# Patient Record
Sex: Female | Born: 1960 | Race: White | Hispanic: No | Marital: Single | Smoking: Former smoker
Health system: Southern US, Community
[De-identification: ages and names within clinical notes are randomized; demographics above are authoritative.]

## PROBLEM LIST (undated history)

## (undated) DIAGNOSIS — H8109 Meniere's disease, unspecified ear: Secondary | ICD-10-CM

## (undated) DIAGNOSIS — K8020 Calculus of gallbladder without cholecystitis without obstruction: Secondary | ICD-10-CM

## (undated) DIAGNOSIS — E785 Hyperlipidemia, unspecified: Secondary | ICD-10-CM

## (undated) DIAGNOSIS — I10 Essential (primary) hypertension: Secondary | ICD-10-CM

## (undated) DIAGNOSIS — D649 Anemia, unspecified: Secondary | ICD-10-CM

## (undated) DIAGNOSIS — F419 Anxiety disorder, unspecified: Secondary | ICD-10-CM

## (undated) DIAGNOSIS — Z8719 Personal history of other diseases of the digestive system: Secondary | ICD-10-CM

## (undated) DIAGNOSIS — K219 Gastro-esophageal reflux disease without esophagitis: Secondary | ICD-10-CM

## (undated) HISTORY — DX: Hyperlipidemia, unspecified: E78.5

## (undated) HISTORY — PX: TONSILLECTOMY: SUR1361

## (undated) HISTORY — DX: Essential (primary) hypertension: I10

## (undated) HISTORY — PX: OTHER SURGICAL HISTORY: SHX169

## (undated) HISTORY — DX: Meniere's disease, unspecified ear: H81.09

## (undated) HISTORY — DX: Anxiety disorder, unspecified: F41.9

## (undated) HISTORY — DX: Calculus of gallbladder without cholecystitis without obstruction: K80.20

---

## 2012-01-22 ENCOUNTER — Other Ambulatory Visit (HOSPITAL_COMMUNITY): Payer: Self-pay

## 2012-03-03 ENCOUNTER — Encounter (HOSPITAL_COMMUNITY): Payer: Self-pay | Admitting: Pharmacy Technician

## 2012-03-09 ENCOUNTER — Encounter (HOSPITAL_COMMUNITY): Payer: Self-pay

## 2012-03-09 ENCOUNTER — Ambulatory Visit (HOSPITAL_COMMUNITY)
Admission: RE | Admit: 2012-03-09 | Discharge: 2012-03-09 | Disposition: A | Discharge status: Home / Self Care | Payer: BC Managed Care – PPO | Source: Ambulatory Visit | Attending: Podiatry | Admitting: Podiatry

## 2012-03-09 ENCOUNTER — Encounter (HOSPITAL_COMMUNITY)
Admission: RE | Admit: 2012-03-09 | Discharge: 2012-03-09 | Disposition: A | Discharge status: Home / Self Care | Payer: BC Managed Care – PPO | Source: Ambulatory Visit | Attending: Podiatry | Admitting: Podiatry

## 2012-03-09 HISTORY — DX: Anemia, unspecified: D64.9

## 2012-03-09 HISTORY — DX: Personal history of other diseases of the digestive system: Z87.19

## 2012-03-09 HISTORY — DX: Gastro-esophageal reflux disease without esophagitis: K21.9

## 2012-03-09 LAB — BASIC METABOLIC PANEL
Calcium: 9.7 mg/dL (ref 8.4–10.5)
Creatinine, Ser: 0.73 mg/dL (ref 0.50–1.10)
GFR calc Af Amer: >90 mL/min (ref >90)

## 2012-03-09 LAB — HEMOGLOBIN AND HEMATOCRIT, BLOOD: Hemoglobin: 13.5 g/dL (ref 12.0–15.0)

## 2012-03-09 NOTE — Patient Instructions (Addendum)
Yesenia Cox  03/09/2012   Your procedure is scheduled on:  03/17/2012  Report to [facility] at  615  AM.  Call this number if you have problems the morning of surgery: [phone]   Remember:   Do not eat food or drink liquids after midnight.   Take these medicines the morning of surgery with A SIP OF WATER: pristiq,protonix   Do not wear jewelry, make-up or nail polish.  Do not wear lotions, powders, or perfumes.   Do not shave 48 hours prior to surgery. Men may shave face and neck.  Do not bring valuables to the hospital.  Contacts, dentures or bridgework may not be worn into surgery.  Leave suitcase in the car. After surgery it may be brought to your room.  For patients admitted to the hospital, checkout time is 11:00 AM the day of discharge.   Patients discharged the day of surgery will not be allowed to drive  home.  Name and phone number of your driver: family  Special Instructions: Shower using CHG 2 nights before surgery and the night before surgery.  If you shower the day of surgery use CHG.  Use special wash - you have one bottle of CHG for all showers.  You should use approximately 1/3 of the bottle for each shower.   Please read over the following fact sheets that you were given: Pain Booklet, Coughing and Deep Breathing, MRSA Information, Surgical Site Infection Prevention, Anesthesia Post-op Instructions and Care and Recovery After Surgery Bunionectomy A bunionectomy is surgery to remove a bunion. A bunion is an enlargement of the joint at the base of the big toe. It is made up of bone and soft tissue on the inside part of the joint. Over time, a painful lump appears on the inside of the joint. The big toe begins to point inward toward the second toe. New bone growth can occur and a bone spur may form. The pain eventually causes difficulty walking. A bunion usually results from inflammation caused by the irritation of poorly fitting shoes. It often begins later in  life. A bunionectomy is performed when nonsurgical treatment no longer works. When surgery is needed, the extent of the procedure will depend on the degree of deformity of the foot. Your surgeon will discuss with you the different procedures and what will work best for you depending on your age and health. LET YOUR CAREGIVER KNOW ABOUT:   Previous problems with anesthetics or medicines used to numb the skin.  Allergies to dyes, iodine, foods, and/or latex.  Medicines taken including herbs, eye drops, prescription medicines (especially medicines used to "thin the blood"), aspirin and other over-the-counter medicines, and steroids (by mouth or as a cream).  History of bleeding or blood problems.  Possibility of pregnancy, if this applies.  History of blood clots in your legs and/or lungs .  Previous surgery.  Other important health problems. RISKS AND COMPLICATIONS   Infection.  Pain.  Nerve damage.  Possibility that the bunion will recur. BEFORE THE PROCEDURE  You should be present 60 minutes prior to your procedure or as directed.  PROCEDURE  Surgery is often done so that you can go home the same day (outpatient). It may be done in a hospital or in an outpatient surgical center. An anesthetic will be used to help you sleep during the procedure. Sometimes, a spinal anesthetic is used to make you numb below the waist. A cut (incision) is made over  the swollen area at the first joint of the big toe. The enlarged lump will be removed. If there is a need to reposition the bones of the big toe, this may require more than 1 incision. The bone itself may need to be cut. Screws and wires may be used in the repair. These can be removed at a later date. In severe cases, the entire joint may need to be removed and a joint replacement inserted. When done, the incision is closed with stitches (sutures). Skin adhesive strips may be added for reinforcement. They help hold the incision closed.  AFTER  THE PROCEDURE  Compression bandages (dressings) are then wrapped around the wound. This helps to keep the foot in alignment and reduce swelling. Your foot will be monitored for bleeding and swelling. You will need to stay for a few hours in the recovery area before being discharged. This allows time for the anesthesia to wear off. You will be discharged home when you are awake, stable, and doing well. HOME CARE INSTRUCTIONS   You can expect to return to normal activities within 6 to 8 weeks after surgery. The foot is at increased risk for swelling for several months. When you can expect to bear weight on the operated foot will depend on the extent of your surgery. The milder the deformity, the less tissue is removed and the sooner the return to normal activity level. During the recovery period, a special shoe, boot, or cast may be worn to accommodate the surgical bandage and to help provide stability to the foot.  Once you are home, an ice pack applied to the operative site may help with discomfort and keep swelling down. Stop using the ice if it causes discomfort.  Keep your feet raised (elevated) when possible to lessen swelling.  If you have an elastic bandage on your foot and you have numbness, tingling, or your foot becomes cold and blue, adjust the bandage to make it comfortable.  Change dressings as directed.  Keep the wound dry and clean. The wound may be washed gently with soap and water. Gently blot dry without rubbing. Do not take baths or use swimming pools or hot tubs for 10 days, or as instructed by your caregiver.  Only take over-the-counter or prescription medicines for pain, discomfort, or fever as directed by your caregiver.  You may continue a normal diet as directed.  For activity, use crutches with no weight bearing or your orthopedic shoe as directed. Continue to use crutches or a cane as directed until you can stand without causing pain. SEEK MEDICAL CARE IF:   You have  redness, swelling, bruising, or increasing pain in the wound.  There is pus coming from the wound.  You have drainage from a wound lasting longer than 1 day.  You have an oral temperature above 102 F (38.9 C).  You notice a bad smell coming from the wound or dressing.  The wound breaks open after sutures have been removed.  You develop dizzy episodes or fainting while standing.  You have persistent nausea or vomiting.  Your toes become cold.  Pain is not relieved with medicines. SEEK IMMEDIATE MEDICAL CARE IF:   You develop a rash.  You have difficulty breathing.  You develop any reaction or side effects to medicines given.  Your toes are numb or blue, or you have severe pain. MAKE SURE YOU:   Understand these instructions.  Will watch your condition.  Will get help right away if you  are not doing well or get worse. Document Released: 12/19/2004 Document Revised: 03/30/2011 Document Reviewed: 01/24/2007 [facility] Patient Information 2013 [place], [place]. PATIENT INSTRUCTIONS POST-ANESTHESIA  IMMEDIATELY FOLLOWING SURGERY:  Do not drive or operate machinery for the first twenty four hours after surgery.  Do not make any important decisions for twenty four hours after surgery or while taking narcotic pain medications or sedatives.  If you develop intractable nausea and vomiting or a severe headache please notify your doctor immediately.  FOLLOW-UP:  Please make an appointment with your surgeon as instructed. You do not need to follow up with anesthesia unless specifically instructed to do so.  WOUND CARE INSTRUCTIONS (if applicable):  Keep a dry clean dressing on the anesthesia/puncture wound site if there is drainage.  Once the wound has quit draining you may leave it open to air.  Generally you should leave the bandage intact for twenty four hours unless there is drainage.  If the epidural site drains for more than 36-48 hours please call the anesthesia  department.  QUESTIONS?:  Please feel free to call your physician or the hospital operator if you have any questions, and they will be happy to assist you.

## 2012-03-10 ENCOUNTER — Inpatient Hospital Stay (HOSPITAL_COMMUNITY): Admission: RE | Admit: 2012-03-10 | Payer: Self-pay | Source: Ambulatory Visit

## 2012-03-11 ENCOUNTER — Other Ambulatory Visit (HOSPITAL_COMMUNITY): Payer: Self-pay

## 2012-03-17 ENCOUNTER — Encounter (HOSPITAL_COMMUNITY): Payer: Self-pay | Admitting: *Deleted

## 2012-03-17 ENCOUNTER — Ambulatory Visit (HOSPITAL_COMMUNITY): Payer: BC Managed Care – PPO

## 2012-03-17 ENCOUNTER — Encounter (HOSPITAL_COMMUNITY): Payer: Self-pay | Admitting: Anesthesiology

## 2012-03-17 ENCOUNTER — Encounter (HOSPITAL_COMMUNITY)
Admission: RE | Disposition: A | Discharge status: Home / Self Care | Payer: Self-pay | Source: Ambulatory Visit | Attending: Podiatry

## 2012-03-17 ENCOUNTER — Ambulatory Visit (HOSPITAL_COMMUNITY)
Admission: RE | Admit: 2012-03-17 | Discharge: 2012-03-17 | Disposition: A | Discharge status: Home / Self Care | Payer: BC Managed Care – PPO | Source: Ambulatory Visit | Attending: Podiatry | Admitting: Podiatry

## 2012-03-17 ENCOUNTER — Ambulatory Visit (HOSPITAL_COMMUNITY): Payer: BC Managed Care – PPO | Admitting: Anesthesiology

## 2012-03-17 DIAGNOSIS — M948X9 Other specified disorders of cartilage, unspecified sites: Secondary | ICD-10-CM | POA: Insufficient documentation

## 2012-03-17 DIAGNOSIS — Z01812 Encounter for preprocedural laboratory examination: Secondary | ICD-10-CM | POA: Insufficient documentation

## 2012-03-17 DIAGNOSIS — M25579 Pain in unspecified ankle and joints of unspecified foot: Secondary | ICD-10-CM | POA: Insufficient documentation

## 2012-03-17 DIAGNOSIS — M7752 Other enthesopathy of left foot: Secondary | ICD-10-CM

## 2012-03-17 HISTORY — PX: BONE EXOSTOSIS EXCISION: SHX1249

## 2012-03-17 SURGERY — EXCISION, EXOSTOSIS
Anesthesia: Monitor Anesthesia Care | Site: Foot | Laterality: Left | Wound class: Clean

## 2012-03-17 MED ORDER — BUPIVACAINE LIPOSOME 1.3 % IJ SUSP
8.0000 mL | Freq: Once | INTRAMUSCULAR | Status: DC
  Filled 2012-03-17: qty 20

## 2012-03-17 MED ORDER — FENTANYL CITRATE 0.05 MG/ML IJ SOLN
INTRAMUSCULAR | Status: AC
  Filled 2012-03-17: qty 2

## 2012-03-17 MED ORDER — BUPIVACAINE HCL (PF) 0.5 % IJ SOLN
INTRAMUSCULAR | Status: DC | PRN
  Administered 2012-03-17: 19 mL

## 2012-03-17 MED ORDER — LIDOCAINE HCL (PF) 1 % IJ SOLN
INTRAMUSCULAR | Status: AC
  Filled 2012-03-17: qty 5

## 2012-03-17 MED ORDER — CEFAZOLIN SODIUM 1 G IJ SOLR
INTRAMUSCULAR | Status: AC
  Filled 2012-03-17: qty 10

## 2012-03-17 MED ORDER — ONDANSETRON HCL 4 MG/2ML IJ SOLN
INTRAMUSCULAR | Status: AC
  Filled 2012-03-17: qty 2

## 2012-03-17 MED ORDER — FENTANYL CITRATE 0.05 MG/ML IJ SOLN
INTRAMUSCULAR | Status: DC | PRN
  Administered 2012-03-17 (×3): 25 ug via INTRAVENOUS
  Administered 2012-03-17: 50 ug via INTRAVENOUS

## 2012-03-17 MED ORDER — ONDANSETRON HCL 4 MG/2ML IJ SOLN: 4.0000 mg | Freq: Once | INTRAMUSCULAR | Status: DC | PRN

## 2012-03-17 MED ORDER — CEFAZOLIN SODIUM-DEXTROSE 2-3 GM-% IV SOLR
2.0000 g | Freq: Once | INTRAVENOUS | Status: AC
  Administered 2012-03-17: 2 g via INTRAVENOUS

## 2012-03-17 MED ORDER — ONDANSETRON HCL 4 MG/2ML IJ SOLN
4.0000 mg | Freq: Once | INTRAMUSCULAR | Status: AC
  Administered 2012-03-17: 4 mg via INTRAVENOUS

## 2012-03-17 MED ORDER — FENTANYL CITRATE 0.05 MG/ML IJ SOLN
25.0000 ug | INTRAMUSCULAR | Status: DC | PRN
  Administered 2012-03-17: 50 ug via INTRAVENOUS
  Administered 2012-03-17: 25 ug via INTRAVENOUS

## 2012-03-17 MED ORDER — FENTANYL CITRATE 0.05 MG/ML IJ SOLN: 25.0000 ug | INTRAMUSCULAR | Status: DC | PRN

## 2012-03-17 MED ORDER — MIDAZOLAM HCL 2 MG/2ML IJ SOLN
1.0000 mg | INTRAMUSCULAR | Status: DC | PRN
  Administered 2012-03-17: 2 mg via INTRAVENOUS

## 2012-03-17 MED ORDER — LACTATED RINGERS IV SOLN
INTRAVENOUS | Status: DC
  Administered 2012-03-17: 07:00:00 via INTRAVENOUS

## 2012-03-17 MED ORDER — BUPIVACAINE HCL (PF) 0.5 % IJ SOLN
INTRAMUSCULAR | Status: AC
  Filled 2012-03-17: qty 30

## 2012-03-17 MED ORDER — SODIUM CHLORIDE 0.9 % IJ SOLN
INTRAMUSCULAR | Status: DC | PRN
  Administered 2012-03-17: 09:00:00

## 2012-03-17 MED ORDER — CEFAZOLIN SODIUM-DEXTROSE 2-3 GM-% IV SOLR
INTRAVENOUS | Status: AC
  Filled 2012-03-17: qty 50

## 2012-03-17 MED ORDER — LIDOCAINE HCL (PF) 1 % IJ SOLN
INTRAMUSCULAR | Status: AC
  Filled 2012-03-17: qty 30

## 2012-03-17 MED ORDER — PROPOFOL 10 MG/ML IV EMUL
INTRAVENOUS | Status: AC
  Filled 2012-03-17: qty 40

## 2012-03-17 MED ORDER — MIDAZOLAM HCL 2 MG/2ML IJ SOLN
INTRAMUSCULAR | Status: AC
  Filled 2012-03-17: qty 2

## 2012-03-17 MED ORDER — PROPOFOL INFUSION 10 MG/ML OPTIME
INTRAVENOUS | Status: DC | PRN
  Administered 2012-03-17: 75 ug/kg/min via INTRAVENOUS
  Administered 2012-03-17: 08:00:00 via INTRAVENOUS

## 2012-03-17 SURGICAL SUPPLY — 37 items
BAG HAMPER (MISCELLANEOUS) ×2 IMPLANT
BANDAGE ELASTIC 4 VELCRO NS (GAUZE/BANDAGES/DRESSINGS) ×2 IMPLANT
BANDAGE ESMARK 4X12 BL STRL LF (DISPOSABLE) ×1 IMPLANT
BANDAGE GAUZE ELAST BULKY 4 IN (GAUZE/BANDAGES/DRESSINGS) ×2 IMPLANT
BENZOIN TINCTURE PRP APPL 2/3 (GAUZE/BANDAGES/DRESSINGS) ×2 IMPLANT
BLADE AVERAGE 25X9 (BLADE) ×2 IMPLANT
BLADE SURG 15 STRL LF DISP TIS (BLADE) ×1 IMPLANT
BLADE SURG 15 STRL SS (BLADE) ×1
BNDG ESMARK 4X12 BLUE STRL LF (DISPOSABLE) ×2
CHLORAPREP W/TINT 26ML (MISCELLANEOUS) ×2 IMPLANT
CLOTH BEACON ORANGE TIMEOUT ST (SAFETY) ×2 IMPLANT
COVER LIGHT HANDLE STERIS (MISCELLANEOUS) ×4 IMPLANT
CUFF TOURNIQUET SINGLE 18IN (TOURNIQUET CUFF) ×2 IMPLANT
DRAPE C-ARM FOLDED MOBILE STRL (DRAPES) ×2 IMPLANT
DRAPE OEC MINIVIEW 54X84 (DRAPES) ×2 IMPLANT
DRSG ADAPTIC 3X8 NADH LF (GAUZE/BANDAGES/DRESSINGS) ×2 IMPLANT
DURA STEPPER MED (CAST SUPPLIES) ×2 IMPLANT
ELECT REM PT RETURN 9FT ADLT (ELECTROSURGICAL) ×2
ELECTRODE REM PT RTRN 9FT ADLT (ELECTROSURGICAL) ×1 IMPLANT
GLOVE BIO SURGEON STRL SZ7.5 (GLOVE) ×2 IMPLANT
GOWN STRL REIN XL XLG (GOWN DISPOSABLE) ×4 IMPLANT
KIT ROOM TURNOVER APOR (KITS) ×2 IMPLANT
MANIFOLD NEPTUNE II (INSTRUMENTS) ×2 IMPLANT
NEEDLE HYPO 18GX1.5 BLUNT FILL (NEEDLE) ×2 IMPLANT
NEEDLE HYPO 27GX1-1/4 (NEEDLE) ×8 IMPLANT
NS IRRIG 1000ML POUR BTL (IV SOLUTION) ×2 IMPLANT
PACK BASIC LIMB (CUSTOM PROCEDURE TRAY) ×2 IMPLANT
PAD ARMBOARD 7.5X6 YLW CONV (MISCELLANEOUS) ×2 IMPLANT
RASP SM TEAR CROSS CUT (RASP) ×2 IMPLANT
SET BASIN LINEN APH (SET/KITS/TRAYS/PACK) ×2 IMPLANT
SPONGE GAUZE 4X4 12PLY (GAUZE/BANDAGES/DRESSINGS) ×2 IMPLANT
SPONGE LAP 18X18 X RAY DECT (DISPOSABLE) ×2 IMPLANT
STRIP CLOSURE SKIN 1/2X4 (GAUZE/BANDAGES/DRESSINGS) ×2 IMPLANT
SUT ETHIBOND 3 0 (SUTURE) ×2 IMPLANT
SUT VIC AB 4-0 PS2 27 (SUTURE) ×2 IMPLANT
SYR CONTROL 10ML LL (SYRINGE) ×8 IMPLANT
TOWEL OR 17X26 4PK STRL BLUE (TOWEL DISPOSABLE) IMPLANT

## 2012-03-17 NOTE — H&P (Signed)
HISTORY AND PHYSICAL INTERVAL NOTE:  03/17/2012  7:15 AM  Yesenia Cox  has presented today for surgery, with the diagnosis of symptomatic os peroneum left foot.  The various methods of treatment have been discussed with the patient.  No guarantees were given.  After consideration of risks, benefits and other options for treatment, the patient has consented to surgery.  I have reviewed the patients' chart and labs.    Patient Vitals for the past 24 hrs:  BP Temp Resp SpO2  03/17/12 0700 119/72 mmHg - 39 98 %  03/17/12 0643 117/76 mmHg - 22 99 %  03/17/12 0642 - 98.6 F (37 C) - -    A history and physical examination was performed in my office on 03/09/2012.  The patient was reexamined.  There have been no changes to this history and physical examination.  Dallas Schimke, DPM

## 2012-03-17 NOTE — Brief Op Note (Signed)
BRIEF OPERATIVE NOTE  SURGEON:   Dallas Schimke, DPM  OR STAFF:   Circulator: Rogene Houston, RN Scrub Person: Hurshel Party, CST; Eliane Decree Page, RN   PREOPERATIVE DIAGNOSIS:   Symptomatic os peroneum left foot  POSTOPERATIVE DIAGNOSIS: Same  PROCEDURE: Excision of os peroneum left foot  ANESTHESIA:  Monitor Anesthesia Care   HEMOSTASIS:   Pneumatic ankle tourniquet set at 250 mmHg  ESTIMATED BLOOD LOSS:   Minimal (<5 cc)  MATERIALS USED:  None  INJECTABLES: 0.5% Marcain plain and Exparel  PATHOLOGY:   Os peroneum  COMPLICATIONS:   None  INDICATIONS:  Painful os peroneum of the left foot that has not responded to conservative care.  DICTATION:  Office manager

## 2012-03-17 NOTE — Transfer of Care (Signed)
Immediate Anesthesia Transfer of Care Note  Patient: Yesenia Cox  Procedure(s) Performed: Procedure(s) with comments: EXOSTOSIS EXCISION (Left) - Excision of Os-Peroneum Left Foot  Patient Location: PACU  Anesthesia Type:MAC  Level of Consciousness: awake, alert  and oriented  Airway & Oxygen Therapy: Patient Spontanous Breathing and Patient connected to face mask oxygen  Post-op Assessment: Report given to PACU RN  Post vital signs: Reviewed and stable  Complications: No apparent anesthesia complications

## 2012-03-17 NOTE — Op Note (Signed)
OPERATIVE REPORT  SURGEON:   Dallas Schimke, DPM  OR STAFF:   Circulator: Rogene Houston, RN Scrub Person: Hurshel Party, CST; Eliane Decree Page, RN   PREOPERATIVE DIAGNOSIS:   Symptomatic os peroneum left foot  POSTOPERATIVE DIAGNOSIS: Same  PROCEDURE: Excision of os peroneum left foot  ANESTHESIA:  Monitor Anesthesia Care   HEMOSTASIS:   Pneumatic ankle tourniquet set at 250 mmHg  ESTIMATED BLOOD LOSS:   Minimal (<5 cc)  MATERIALS USED:  None  INJECTABLES: 0.5% Marcaine plain and Exparel  PATHOLOGY:   Os peroneum  COMPLICATIONS:   None  INDICATIONS:  Painful os peroneum of the left foot that has not responded to conservative care.  DESCRIPTION OF THE PROCEDURE:   The patient was brought to the operating room and placed on the operative table in the supine position.  A pneumatic ankle tourniquet was applied to the patient's ankle.  Following sedation, the surgical site was anesthetized with 0.5% Marcaine plain.  The foot was then prepped, scrubbed, and draped in the usual sterile technique.  The foot was elevated, exsanguinated and the pneumatic ankle tourniquet inflated to 250 mmHg.    Attention was directed to the lateral aspect of the left foot.  Fluoroscopy was used to identify the location of the os peroneum.  A linear longitudinal incision was made along the lateral aspect of the left foot.  Dissection was continued deep down to the level of the peroneal tendons.  The peroneus brevis tendon was retracted and the os peroneum identified.  The peroneal longus tendon was evaluated and no longitudinal tear was identified.  The os peroneum was freed of its attachment to the peroneal longus tendon.  It was passed from the operative field and sent to pathology for evaluation.  The portion of the tendon encompassing the os peroneum was tubularized using 3-0 Ethibond in a simple suture technique.  The wound was irrigated with copious amounts of sterile  irrigant.  The subcutaneous structures were reapproximated using 4-0 Vicryl.  The skin was reapproximated using 4-0 Vicryl and a running subcuticular manner.  The incision was reinforced with Steri-Strips.  Exparel was then injected around the surgical field.  A sterile, compressive dressing was applied.  The pneumatic ankle tourniquet was released and a prompt hyperemic response was noted to all digits of the operative foot.  The patient tolerated the procedure well.  The patient was then transferred to PACU with vital signs stable and vascular status intact to all toes of the operative foot.  Following a period of postoperative monitoring, the patient will be discharged home.

## 2012-03-17 NOTE — Anesthesia Preprocedure Evaluation (Signed)
Anesthesia Evaluation  Patient identified by MRN, date of birth, ID band Patient awake    Reviewed: Allergy & Precautions, H&P , NPO status , Patient's Chart, lab work & pertinent test results  Airway Mallampati: II TM Distance: >3 FB     Dental  (+) Teeth Intact   Pulmonary former smoker,  breath sounds clear to auscultation        Cardiovascular negative cardio ROS  Rhythm:Regular Rate:Normal     Neuro/Psych    GI/Hepatic hiatal hernia, GERD-  ,  Endo/Other    Renal/GU      Musculoskeletal   Abdominal   Peds  Hematology   Anesthesia Other Findings   Reproductive/Obstetrics                           Anesthesia Physical Anesthesia Plan  ASA: II  Anesthesia Plan: MAC   Post-op Pain Management:    Induction: Intravenous  Airway Management Planned: Nasal Cannula  Additional Equipment:   Intra-op Plan:   Post-operative Plan:   Informed Consent: I have reviewed the patients History and Physical, chart, labs and discussed the procedure including the risks, benefits and alternatives for the proposed anesthesia with the patient or authorized representative who has indicated his/her understanding and acceptance.     Plan Discussed with:   Anesthesia Plan Comments:         Anesthesia Quick Evaluation

## 2012-03-17 NOTE — Anesthesia Postprocedure Evaluation (Signed)
  Anesthesia Post-op Note  Patient: Yesenia Cox  Procedure(s) Performed: Procedure(s) with comments: EXOSTOSIS EXCISION (Left) - Excision of Os-Peroneum Left Foot  Patient Location: PACU  Anesthesia Type:MAC  Level of Consciousness: awake, alert  and oriented  Airway and Oxygen Therapy: Patient Spontanous Breathing and Patient connected to face mask oxygen  Post-op Pain: none  Post-op Assessment: Post-op Vital signs reviewed, Patient's Cardiovascular Status Stable, Respiratory Function Stable and Patent Airway  Post-op Vital Signs: Reviewed and stable  Complications: No apparent anesthesia complications

## 2012-03-18 ENCOUNTER — Encounter (HOSPITAL_COMMUNITY): Payer: Self-pay | Admitting: Podiatry

## 2012-10-14 HISTORY — PX: COLONOSCOPY: SHX174

## 2013-05-04 IMAGING — CR DG FOOT COMPLETE 3+V*L*
3 series · 3 of 3 positions shown · non-contrast
Comparison: None.

CLINICAL DATA: Preop.

LEFT FOOT - COMPLETE 3+ VIEW

[view not recorded (1 of 3)]
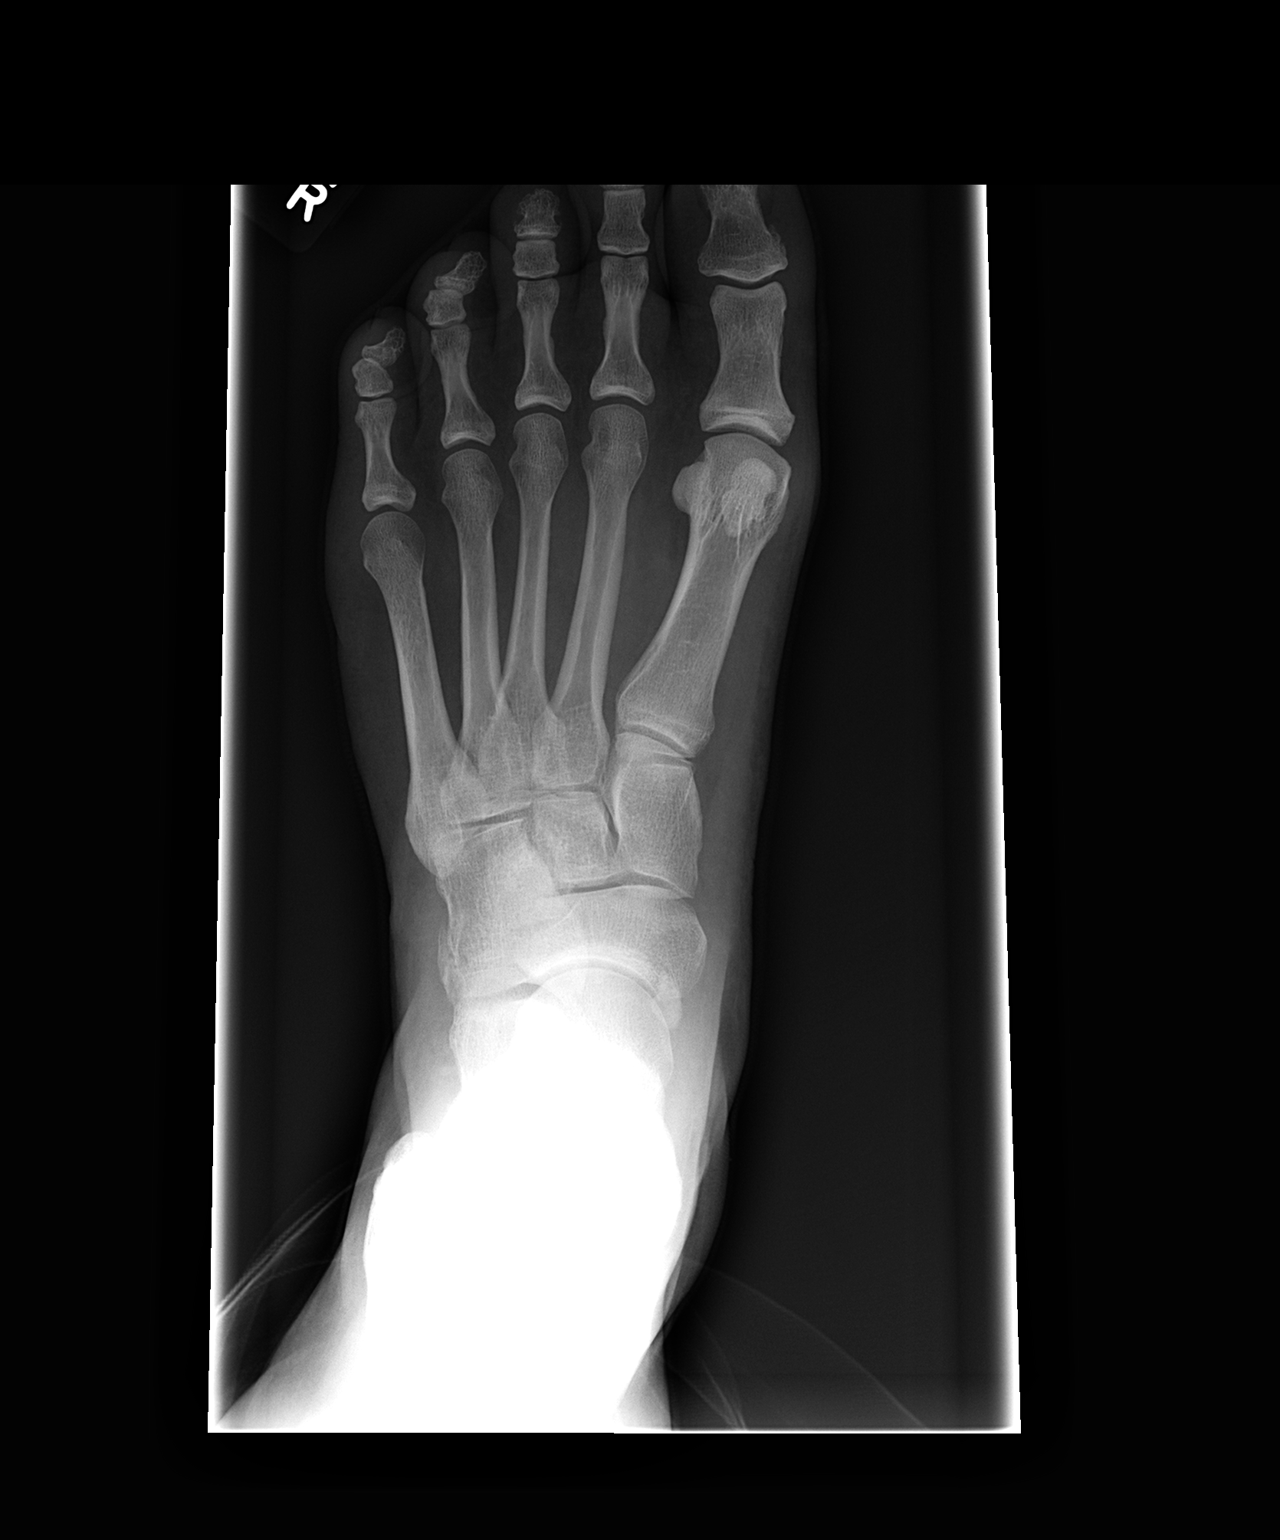

[view not recorded (2 of 3)]
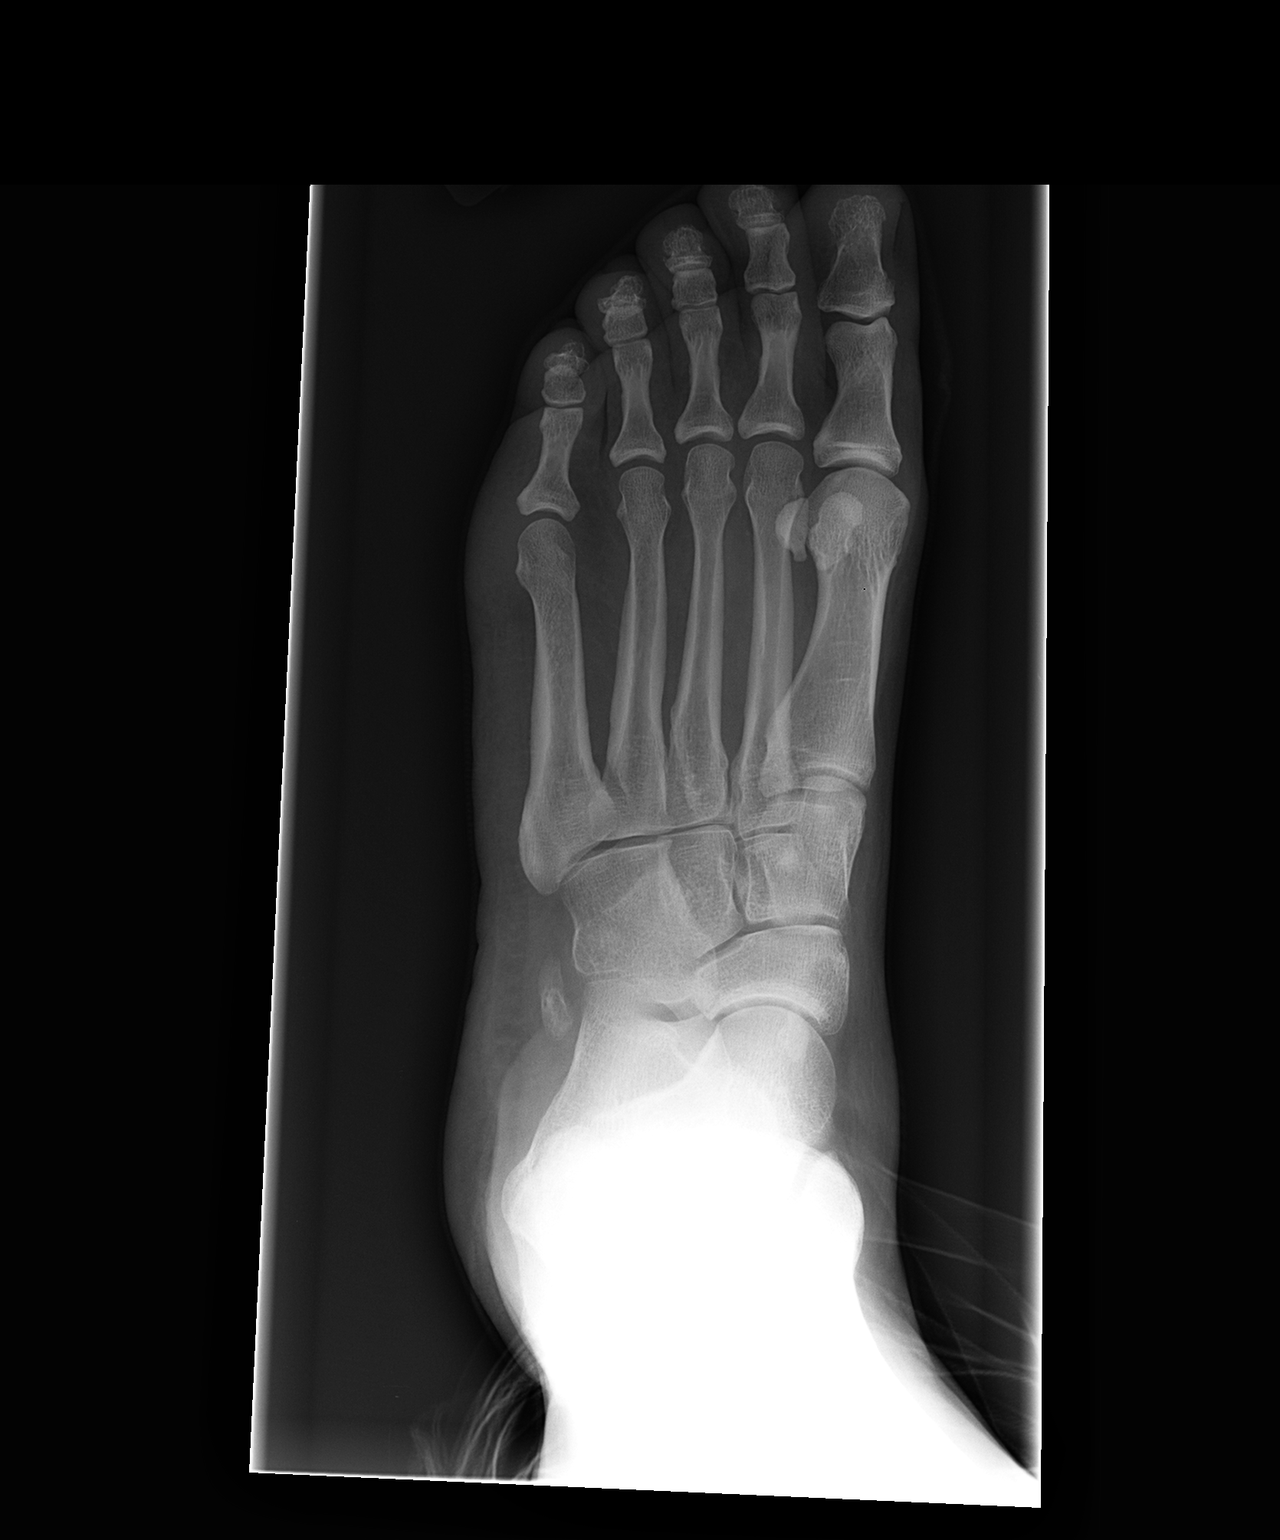

[view not recorded (3 of 3)]
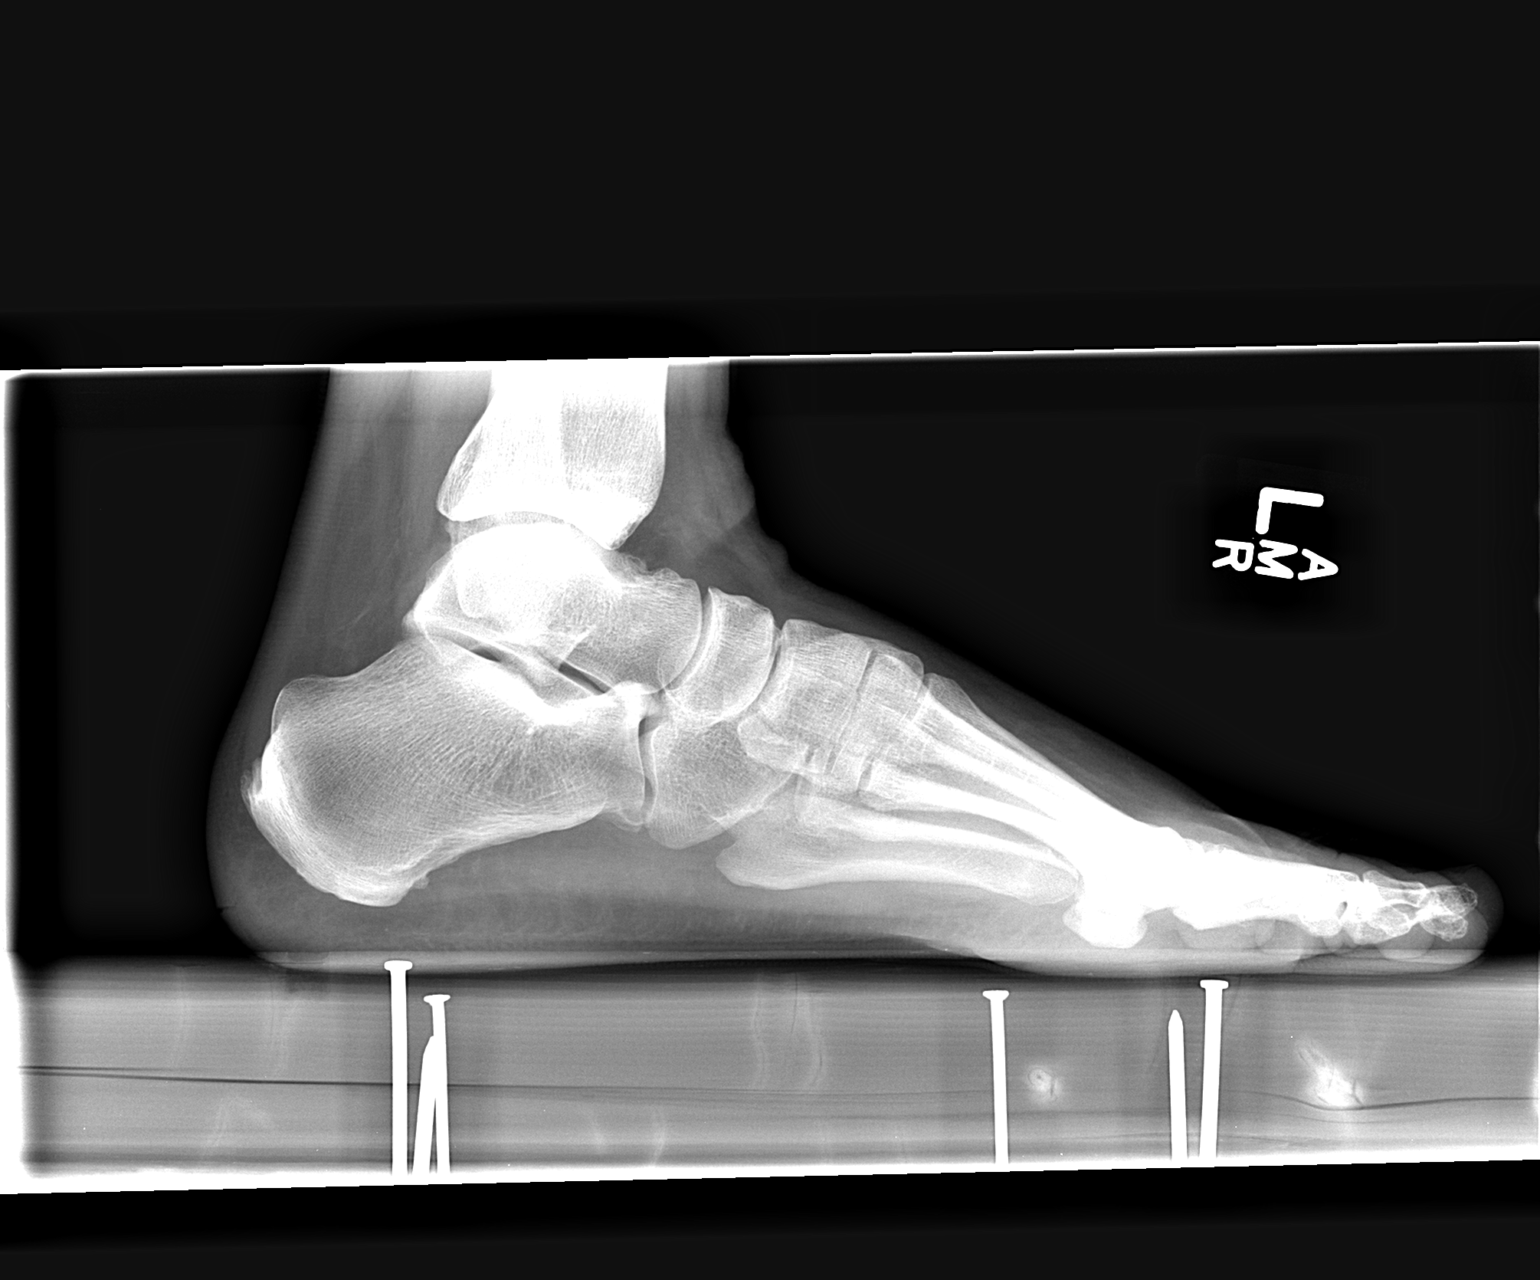

[3 of 3 positions shown; findings below may reference images not displayed]

FINDINGS: No acute osseous or joint abnormality.  Small calcaneal
spurs.
IMPRESSION: No acute findings.

## 2013-05-12 IMAGING — CR DG FOOT COMPLETE 3+V*L*
3 series · 3 of 3 positions shown · non-contrast
Comparison: 03/09/2012

CLINICAL DATA: Postop excision of the os peroneum.

LEFT FOOT - COMPLETE 3+ VIEW

[view not recorded (1 of 3)]
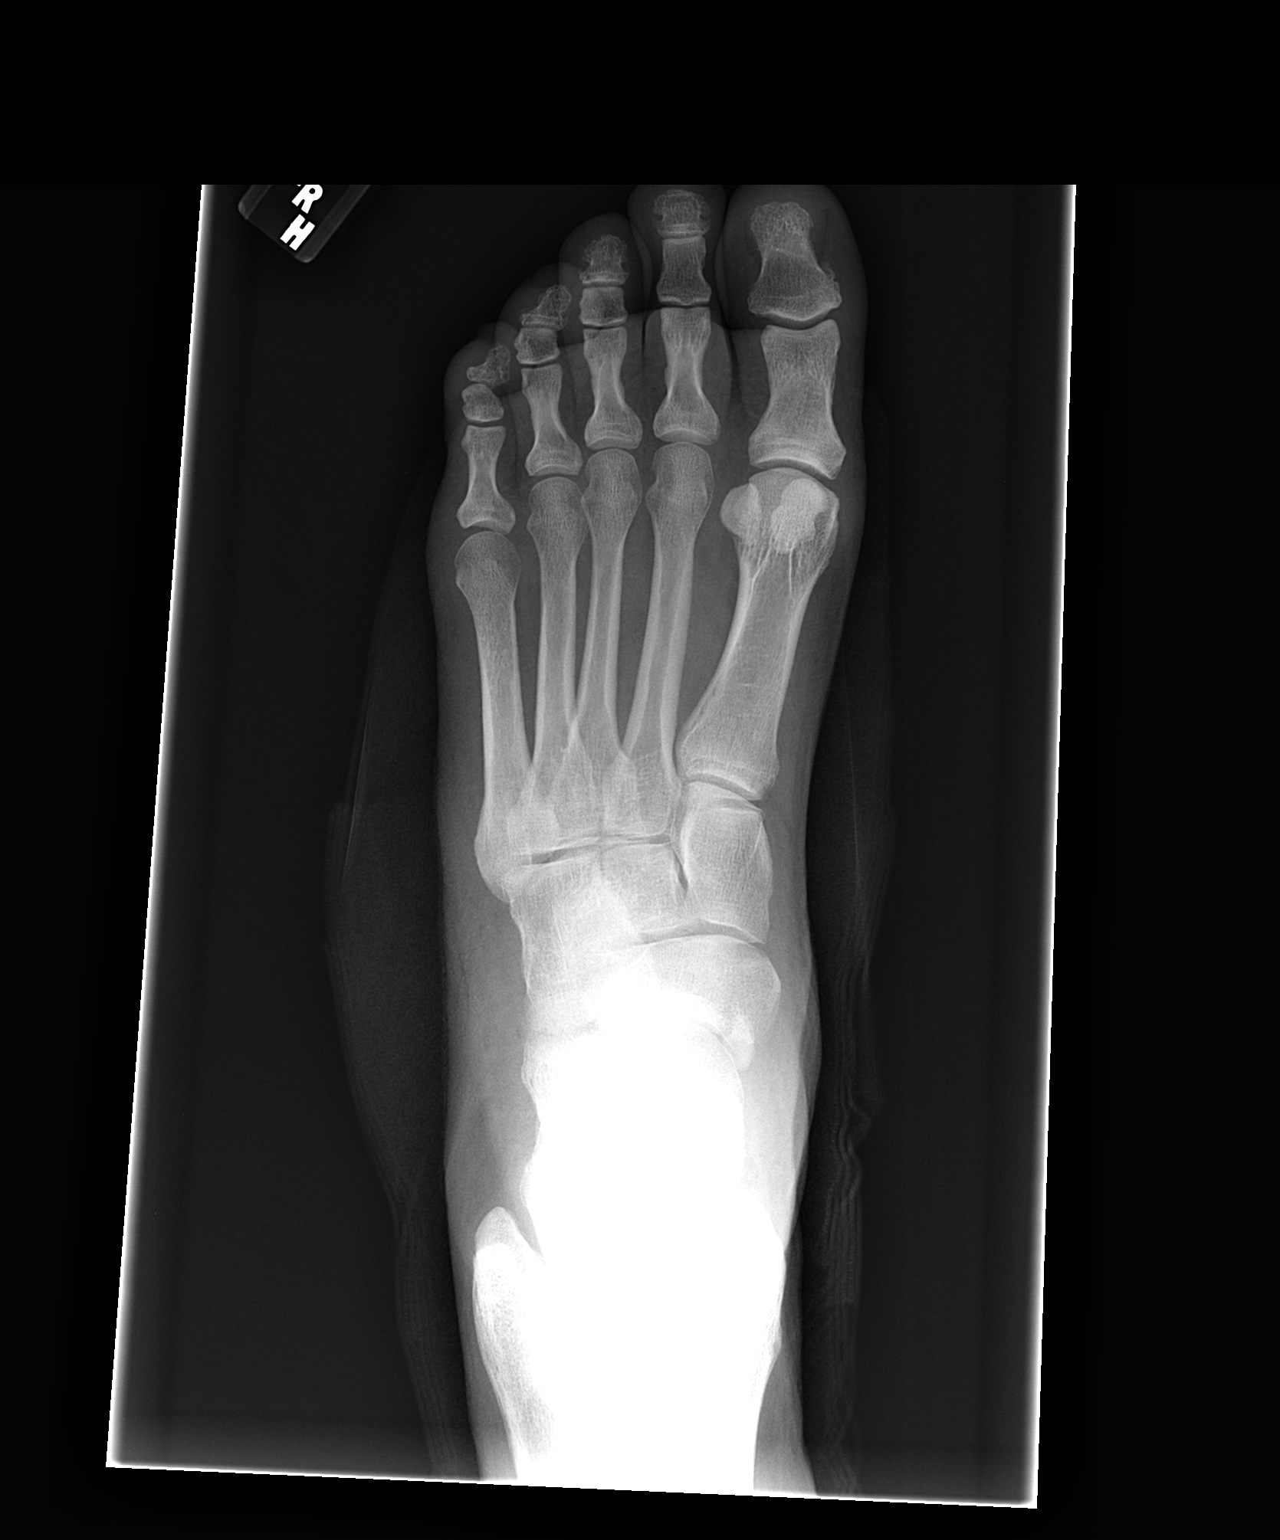

[view not recorded (2 of 3)]
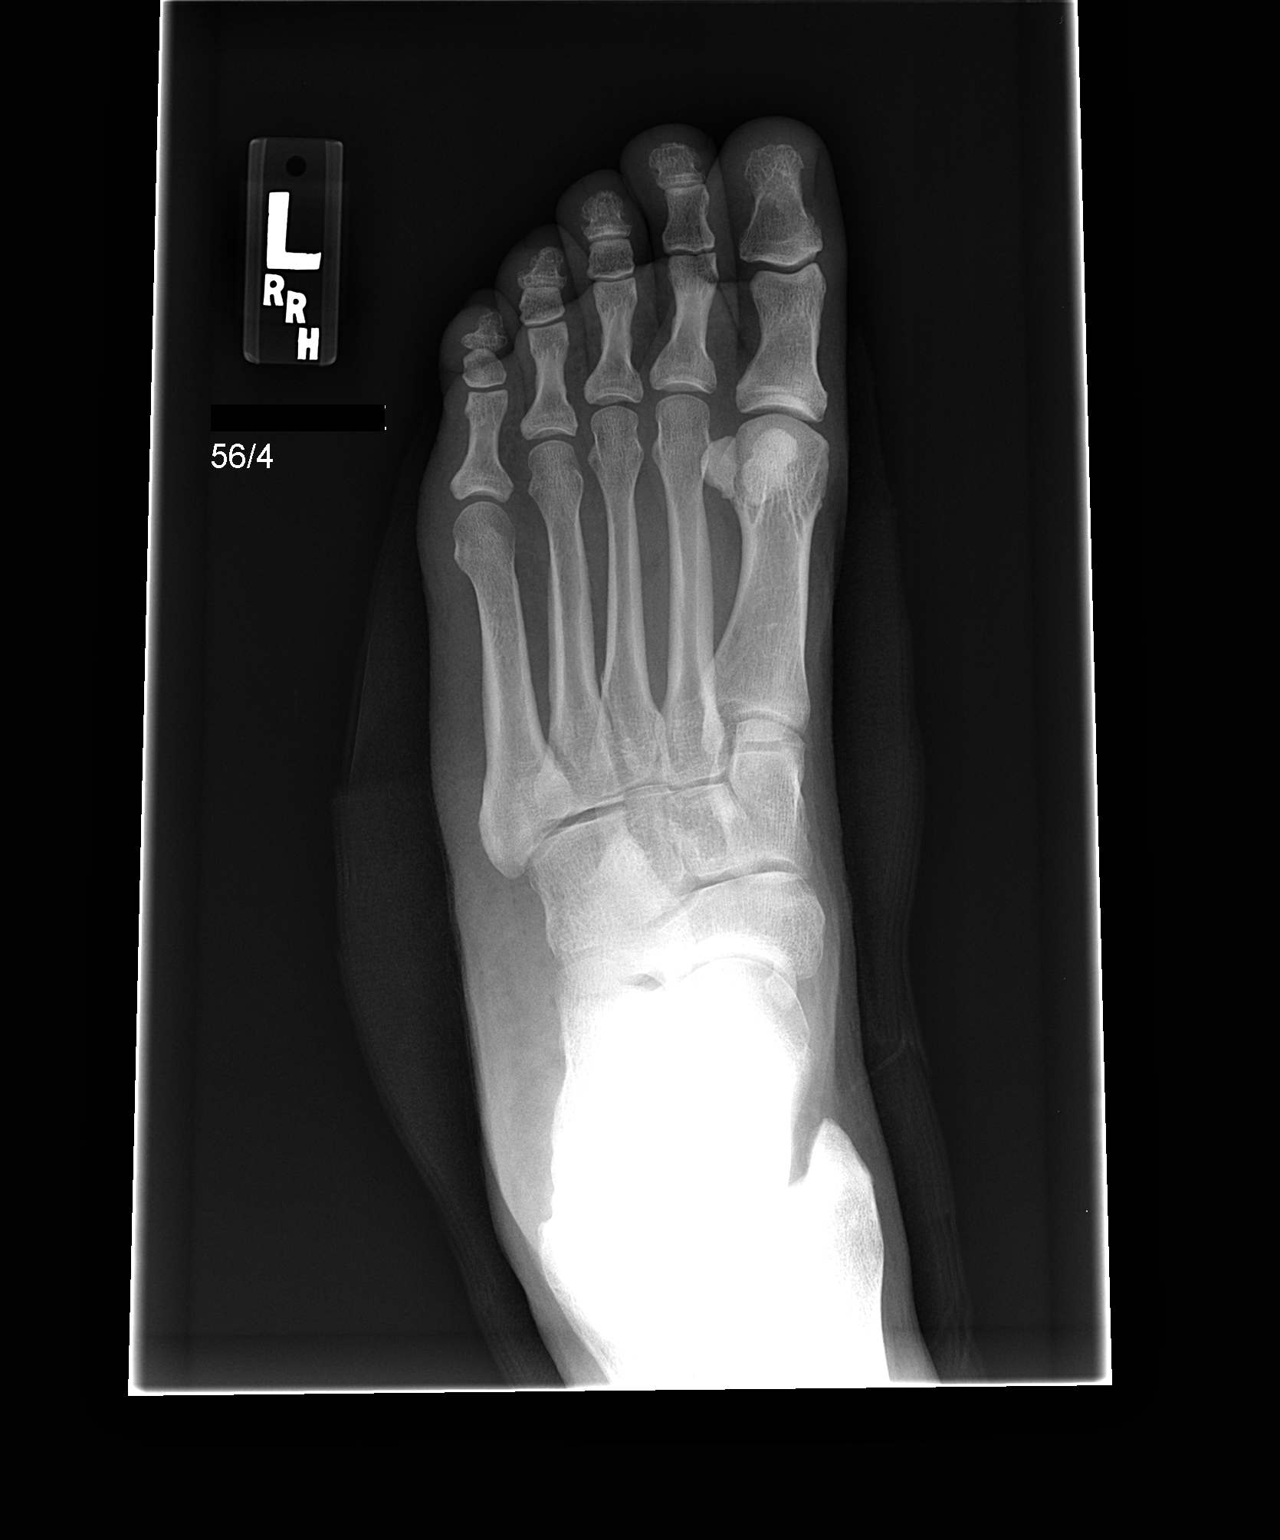

[view not recorded (3 of 3)]
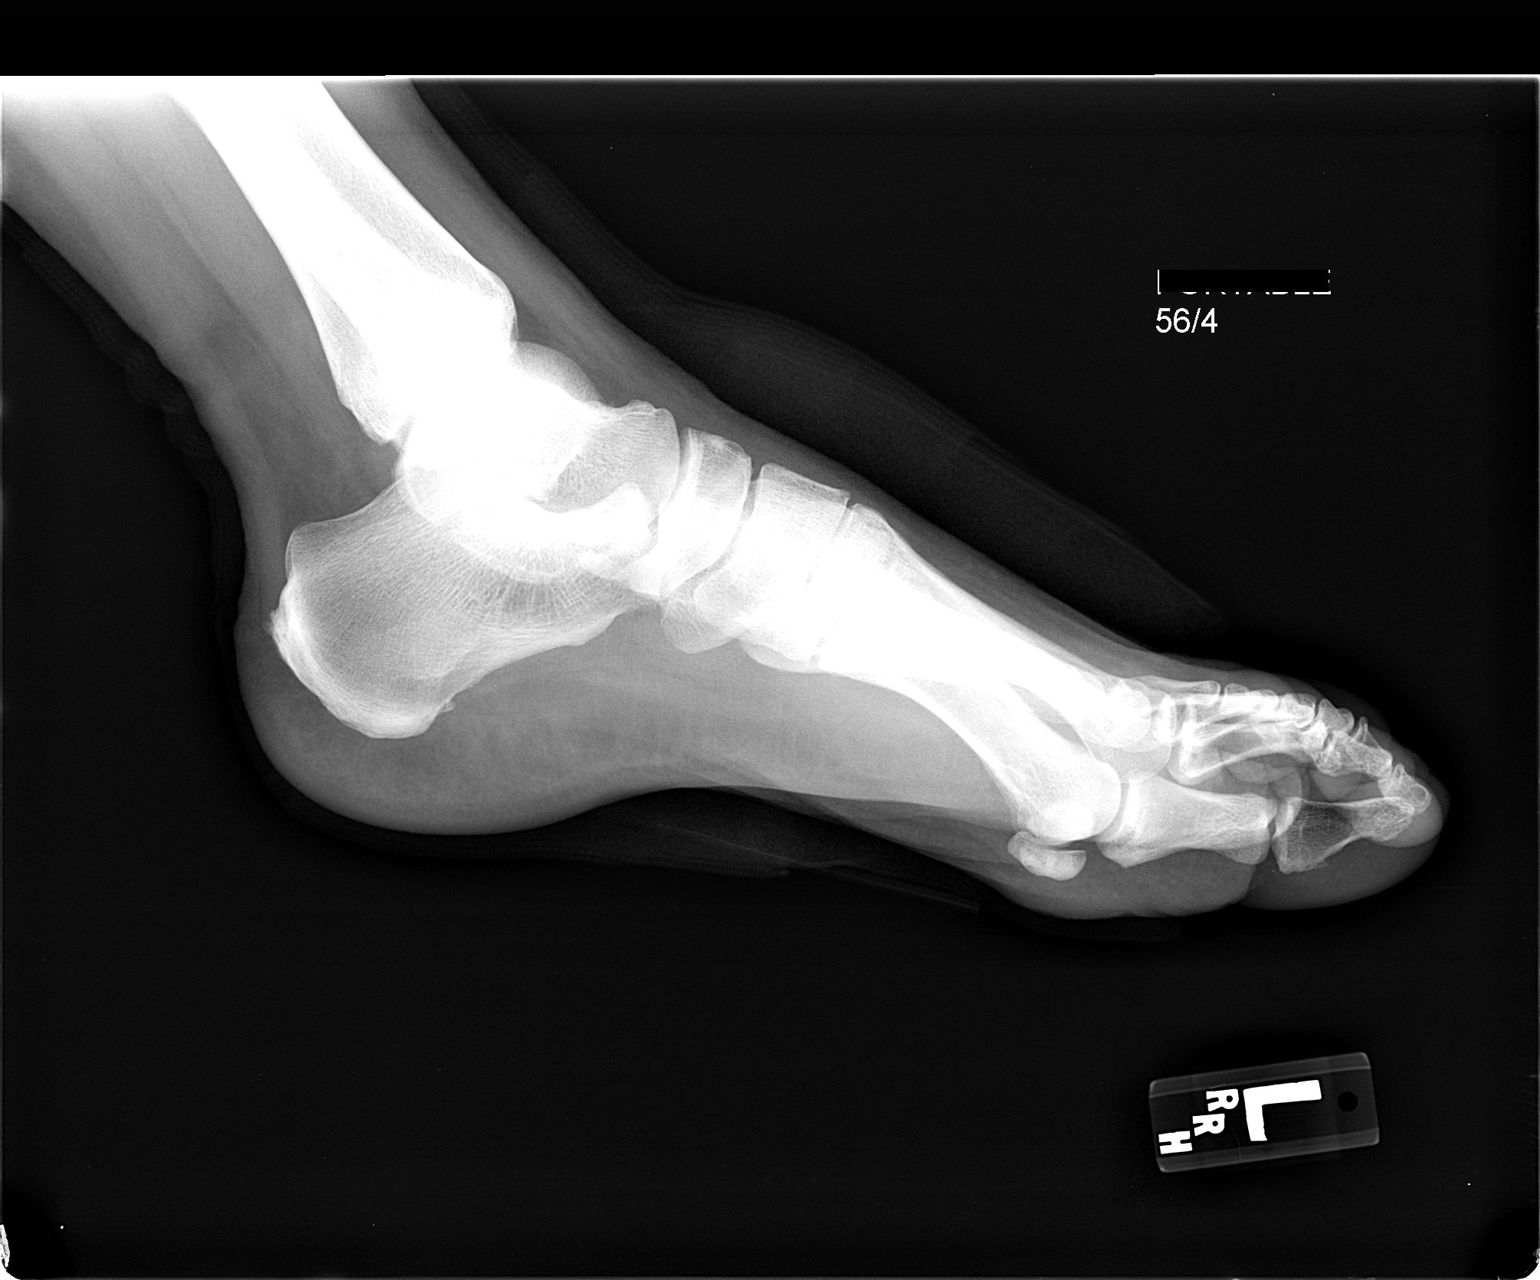

[3 of 3 positions shown; findings below may reference images not displayed]

FINDINGS: Interval excision of the os peroneum.  Slight soft tissue
stranding in the area.  No acute bony abnormality.
IMPRESSION: Postoperative changes.  No acute bony abnormality.

## 2014-09-18 ENCOUNTER — Encounter: Payer: Self-pay | Admitting: Family Medicine

## 2016-07-29 ENCOUNTER — Telehealth: Payer: Self-pay | Admitting: Gastroenterology

## 2016-08-11 ENCOUNTER — Encounter: Payer: Self-pay | Admitting: Gastroenterology

## 2016-08-11 NOTE — Telephone Encounter (Signed)
Dr.Armbruster reviewed records and noted patient can come in for an office visit to discuss why patient needs to have a repeat colonoscopy at this time. Left message for patient to call back and schedule appointment.

## 2016-10-06 ENCOUNTER — Encounter: Payer: Self-pay | Admitting: Gastroenterology

## 2016-10-06 ENCOUNTER — Ambulatory Visit (INDEPENDENT_AMBULATORY_CARE_PROVIDER_SITE_OTHER): Payer: BLUE CROSS/BLUE SHIELD | Admitting: Gastroenterology

## 2016-10-06 ENCOUNTER — Encounter (INDEPENDENT_AMBULATORY_CARE_PROVIDER_SITE_OTHER): Payer: Self-pay

## 2016-10-06 VITALS — BP 110/74 | HR 80 | Ht 63.5 in | Wt 217.1 lb

## 2016-10-06 DIAGNOSIS — K219 Gastro-esophageal reflux disease without esophagitis: Secondary | ICD-10-CM

## 2016-10-06 DIAGNOSIS — R195 Other fecal abnormalities: Secondary | ICD-10-CM

## 2016-10-06 DIAGNOSIS — K625 Hemorrhage of anus and rectum: Secondary | ICD-10-CM

## 2016-10-06 MED ORDER — SOD PICOSULFATE-MAG OX-CIT ACD 10-3.5-12 MG-GM -GM/160ML PO SOLN: 1.0000 | Freq: Once | ORAL | 0 refills | Status: AC

## 2016-10-06 NOTE — Progress Notes (Signed)
HPI :  56 y/o female with a history of GERD, anemia, here for a consultative visit at the request of Dr. Marjean Donna for a colonoscopy.  Patient reports having an episode of blood in the stools in July. She had red blood in the toilet x 1 mixed with the stools. She thought she had some perinal pain at the time. This has since resolved and not recurred. She then had positive occult blood testing per her gynecologist, leading to request for a colonoscopy. Her last colonoscopy was in 2014. She has some constipation at times, tries to manage it with diet and drinking water. This typically works for her. Rarely needs to take laxatives. No abdominal pains. No FH of colon cancer. She has some occasional bloating. No unexpected weight loss.   She has a history of reflux, she takes pantoprazole  once daily. This works well for her. She has heartburn and regurgitation. She denies any breakthrough of symptoms. No dysphagia. No FH of esophagus. Prior smoker, quit > 15 years ago. She has had a prior upper endoscopy around 5-10 years, done in [place], Dr. Despina Hidden. No Barrett's. She has had a trial of  protonix in the past and it did not work nearly as well.  Colonoscopy 10/14/2012 - no polyps, diverticulosis left colon  Past Medical History:  Diagnosis Date  . Anemia   . GERD (gastroesophageal reflux disease)   . H/O hiatal hernia      Past Surgical History:  Procedure Laterality Date  . BONE EXOSTOSIS EXCISION Left 03/17/2012   Procedure: EXOSTOSIS EXCISION;  Surgeon: Dallas Schimke, DPM;  Location: [facility];  Service: Orthopedics;  Laterality: Left;  Excision of Os-Peroneum Left Foot  . COLONOSCOPY  10/14/2012   Dr. Elder Cyphers  . roatator cuff Left 2006,June   [facility]  . TONSILLECTOMY  1981,Dec.   [place]   Family History  Problem Relation Age of Onset  . Hepatitis Mother   . Breast cancer Mother   . Heart attack Father   . Hypertension Sister   . Heart attack Sister   .  Stroke Sister   . Diabetes Sister   . Emphysema Sister   . Hypertension Sister   . GI problems Maternal Grandmother   . Heart attack Maternal Grandfather   . Cirrhosis Paternal Grandfather   . Colon cancer Neg Hx   . Rectal cancer Neg Hx   . Esophageal cancer Neg Hx   . Liver cancer Neg Hx    Social History  Substance Use Topics  . Smoking status: Former Smoker    Packs/day: 0.50    Years: 6.00  . Smokeless tobacco: Never Used  . Alcohol use Yes     Comment: Occasionally   Current Outpatient Prescriptions  Medication Sig Dispense Refill  . desvenlafaxine (PRISTIQ) 50 MG 24 hr tablet Take 50 mg by mouth daily.    . MULTIPLE VITAMIN PO Take 1 tablet by mouth daily.    . pantoprazole (PROTONIX) 40 MG tablet Take 40 mg by mouth daily.    . traMADol (ULTRAM) 50 MG tablet Take 50 mg by mouth daily.     . traZODone (DESYREL) 100 MG tablet Take 100 mg by mouth at bedtime.     No current facility-administered medications for this visit.    Allergies  Allergen Reactions  . Codeine Nausea And Vomiting  . Tetracyclines & Related Swelling     Review of Systems: All systems reviewed and negative except where noted in HPI.  No recent labs on file  Physical Exam: BP 110/74   Pulse 80   Ht 5' 3.5" (1.613 m)   Wt 217 lb 2 oz (98.5 kg)   BMI 37.86 kg/m  Constitutional: Pleasant,well-developed, female in no acute distress. HEENT: Normocephalic and atraumatic. Conjunctivae are normal. No scleral icterus. Neck supple.  Cardiovascular: Normal rate, regular rhythm.  Pulmonary/chest: Effort normal and breath sounds normal. No wheezing, rales or rhonchi. Abdominal: Soft, nondistended, nontender, protuberant. There are no masses palpable. No hepatomegaly. Extremities: no edema Lymphadenopathy: No cervical adenopathy noted. Neurological: Alert and oriented to person place and time. Skin: Skin is warm and dry. No rashes noted. Psychiatric: Normal mood and affect. Behavior is  normal.   ASSESSMENT AND PLAN: 56 year old female with history as outlined above here for assessment of the following issues:  Rectal bleeding / positive occult blood in stools - patient with 1 episode of overt rectal bleeding in July which has not recurred, followed by occult positive stool testing of what she thinks was normal stool. Her last colonoscopy was in 2014 and normal which is reassuring. I think it is unlikely she has a high risk lesion given her last colonoscopy, but I offered her a colonoscopy in light of her occult positive stool and history of overt bleeding. I discussed the risks and benefits of colonoscopy and anesthesia with her and she was to proceed. Moving forward, she should avoid stool based testing for screening if she is having colonoscopy exams.  GERD - we discussed long-term risks and benefits of long-term PPI use. Recommend low-dose dose needed to control symptoms. She reports 40 mg of Protonix is the  lowest-dose dose that works for her, will continue this for now. Prior EGD showed no BE per her report, I don't think she warrants another exam at this time.   Ileene Patrick, MD [facility] Pager [phone]  CC: Marjean Donna, MD

## 2016-10-06 NOTE — Patient Instructions (Signed)
You have been scheduled for a colonoscopy. Please follow written instructions given to you at your visit today.  Please pick up your prep supplies at the pharmacy within the next 1-3 days. If you use inhalers (even only as needed), please bring them with you on the day of your procedure. Your physician has requested that you go to www.startemmi.com and enter the access code given to you at your visit today. This web site gives a general overview about your procedure. However, you should still follow specific instructions given to you by our office regarding your preparation for the procedure.   We have given you a sample of Clenpiq today.   If you are age 46 or older, your body mass index should be between 23-30. Your Body mass index is 37.86 kg/m. If this is out of the aforementioned range listed, please consider follow up with your Primary Care Provider.  If you are age 77 or younger, your body mass index should be between 19-25. Your Body mass index is 37.86 kg/m. If this is out of the aformentioned range listed, please consider follow up with your Primary Care Provider.

## 2016-10-20 ENCOUNTER — Encounter: Payer: Self-pay | Admitting: Gastroenterology

## 2016-10-28 ENCOUNTER — Encounter: Payer: Self-pay | Admitting: Gastroenterology

## 2016-10-28 ENCOUNTER — Ambulatory Visit (AMBULATORY_SURGERY_CENTER): Payer: BLUE CROSS/BLUE SHIELD | Admitting: Gastroenterology

## 2016-10-28 VITALS — BP 120/56 | HR 68 | Temp 98.9°F | Resp 13 | Ht 63.0 in | Wt 217.0 lb

## 2016-10-28 DIAGNOSIS — K625 Hemorrhage of anus and rectum: Secondary | ICD-10-CM | POA: Diagnosis present

## 2016-10-28 MED ORDER — SODIUM CHLORIDE 0.9 % IV SOLN: 500.0000 mL | INTRAVENOUS | Status: DC

## 2016-10-28 NOTE — Op Note (Signed)
[facility] Patient Name: Yesenia Cox Procedure Date: 10/28/2016 3:19 PM MRN: [id number] Endoscopist: Viviann Spare P.  MD, MD Age: 56 Referring MD:  Date of Birth: 30-Aug-1960 Gender: Female Account #: [id number] Procedure:                Colonoscopy Indications:              Prior episode of hematochezia, Heme positive stool                            on prior stool testing Medicines:                Monitored Anesthesia Care Procedure:                Pre-Anesthesia Assessment:                           - Prior to the procedure, a History and Physical                            was performed, and patient medications and                            allergies were reviewed. The patient's tolerance of                            previous anesthesia was also reviewed. The risks                            and benefits of the procedure and the sedation                            options and risks were discussed with the patient.                            All questions were answered, and informed consent                            was obtained. Prior Anticoagulants: The patient has                            taken no previous anticoagulant or antiplatelet                            agents. ASA Grade Assessment: II - A patient with                            mild systemic disease. After reviewing the risks                            and benefits, the patient was deemed in                            satisfactory condition to undergo the procedure.  After obtaining informed consent, the colonoscope                            was passed under direct vision. Throughout the                            procedure, the patient's blood pressure, pulse, and                            oxygen saturations were monitored continuously. The                            Colonoscope was introduced through the anus and                            advanced to the the terminal  ileum, with                            identification of the appendiceal orifice and IC                            valve. The colonoscopy was performed without                            difficulty. The patient tolerated the procedure                            well. The quality of the bowel preparation was                            adequate. The terminal ileum, ileocecal valve,                            appendiceal orifice, and rectum were photographed. Scope In: 3:31:11 PM Scope Out: 3:46:42 PM Scope Withdrawal Time: 0 hours 13 minutes 41 seconds  Total Procedure Duration: 0 hours 15 minutes 31 seconds  Findings:                 The perianal and digital rectal examinations were                            normal.                           The terminal ileum appeared normal.                           Internal hemorrhoids were found during                            retroflexion. The hemorrhoids were small.                           The exam was otherwise without abnormality. No  polyps. Complications:            No immediate complications. Estimated blood loss:                            None. Estimated Blood Loss:     Estimated blood loss: none. Impression:               - The examined portion of the ileum was normal.                           - Internal hemorrhoids.                           - The examination was otherwise normal.                           Hemorrhoids are the likely cause for rectal                            bleeding. No further stool studies should be done                            for screening purposes. Repeat colonoscopy in 10                            years for screening Recommendation:           - Patient has a contact number available for                            emergencies. The signs and symptoms of potential                            delayed complications were discussed with the                            patient. Return to  normal activities tomorrow.                            Written discharge instructions were provided to the                            patient.                           - Resume previous diet.                           - Continue present medications.                           - Repeat colonoscopy in 10 years for screening                            purposes. Viviann Spare P.  MD, MD 10/28/2016 3:54:48 PM This report has been signed electronically.

## 2016-10-28 NOTE — Progress Notes (Signed)
Report to PACU, RN, vss, BBS= Clear.  

## 2016-10-28 NOTE — Patient Instructions (Signed)

## 2016-10-29 ENCOUNTER — Telehealth: Payer: Self-pay

## 2016-10-29 NOTE — Telephone Encounter (Signed)
  Follow up Call-  Call back number 10/28/2016  Post procedure Call Back phone  # [phone]  Permission to leave phone message Yes  Some recent data might be hidden     Patient questions:  Do you have a fever, pain , or abdominal swelling? No. Pain Score  0 *  Have you tolerated food without any problems? Yes.    Have you been able to return to your normal activities? Yes.    Do you have any questions about your discharge instructions: Diet   No. Medications  No. Follow up visit  No.  Do you have questions or concerns about your Care? No.  Actions: * If pain score is 4 or above: No action needed, pain <4.

## 2019-04-01 ENCOUNTER — Telehealth (INDEPENDENT_AMBULATORY_CARE_PROVIDER_SITE_OTHER): Payer: BLUE CROSS/BLUE SHIELD | Admitting: Gastroenterology

## 2019-04-01 DIAGNOSIS — R1010 Upper abdominal pain, unspecified: Secondary | ICD-10-CM

## 2019-04-01 NOTE — Telephone Encounter (Signed)
Returned patient's call.  Spent at least approximately 11 minutes on the phone with her discussing symptoms of what she thinks is gallbladder related.  We have only seen her for rectal bleeding and colonoscopy in the past.  Describes right upper quadrant abdominal pain/under her right breast.  Was having heartburn type symptoms as well.  Takes her Protonix daily.  She increased that to twice a day.  Is taking Gaviscon.  Went to the ER in [place] where she tells me they did a CT scan and a chest x-ray and labs and says that they did not see anything.  She feels that it is her gallbladder.  I advised to continue the Protonix twice a day.  I offered to send a prescription for Zofran for nausea, but she declined.  Advised to try heating pad on the right upper quadrant.  Advised to drink plenty of liquids and low fat diet as able.  Advised to try to see her PCP this week if they have access to her records and/or we could try to see her some time in the near future, but certainly would want those records from [facility] as well before proceeding with any further studies.  Was advised that if she becomes extremely sick then will need to return to the emergency department.

## 2019-04-03 NOTE — Telephone Encounter (Signed)
Okay thanks Esther 

## 2019-04-03 NOTE — Telephone Encounter (Signed)
Called patient and she states she has been in the recliner with heating pad most of the weekend. That she has been able to keep down liquids and a bland diet. She has an office visit with her PCP today and is going to ask that they do a RUQ [place] and if they draw blood work, that they also draw a LFTs. She had a CBC done in the [facility] ED. She will call us back if her PCP doesn't do a RUQ [place] and LFTs

## 2019-04-03 NOTE — Telephone Encounter (Signed)
Thanks Gaynell Face can you please call the patient today and see how she is doing? If she has never had a RUQ [place] we can do that and obtain LFTs / CBC to ensure stable, if you can help coordinate. If she has had these tests before would like records and we can see if we have any openings in the clinic with myself or APP for further evaluation. Thanks

## 2022-01-19 HISTORY — PX: CHOLECYSTECTOMY: SHX55

## 2022-03-19 ENCOUNTER — Encounter: Payer: Self-pay | Admitting: Gastroenterology

## 2022-06-03 ENCOUNTER — Encounter: Payer: Self-pay | Admitting: Gastroenterology

## 2022-06-03 ENCOUNTER — Ambulatory Visit (INDEPENDENT_AMBULATORY_CARE_PROVIDER_SITE_OTHER): Payer: BC Managed Care – PPO | Admitting: Gastroenterology

## 2022-06-03 VITALS — BP 108/68 | HR 72 | Ht 63.5 in | Wt 233.0 lb

## 2022-06-03 DIAGNOSIS — Z79899 Other long term (current) drug therapy: Secondary | ICD-10-CM | POA: Diagnosis not present

## 2022-06-03 DIAGNOSIS — K219 Gastro-esophageal reflux disease without esophagitis: Secondary | ICD-10-CM | POA: Diagnosis not present

## 2022-06-03 DIAGNOSIS — Z9049 Acquired absence of other specified parts of digestive tract: Secondary | ICD-10-CM

## 2022-06-03 NOTE — Patient Instructions (Addendum)
If your blood pressure at your visit was 140/90 or greater, please contact your primary care physician to follow up on this. ______________________________________________________  If you are age 60 or older, your body mass index should be between 23-30. Your Body mass index is 40.63 kg/m. If this is out of the aforementioned range listed, please consider follow up with your Primary Care Provider.  If you are age 55 or younger, your body mass index should be between 19-25. Your Body mass index is 40.63 kg/m. If this is out of the aformentioned range listed, please consider follow up with your Primary Care Provider.  ________________________________________________________  The [facility] providers would like to encourage you to use [facility] to communicate with providers for non-urgent requests or questions.  Due to long hold times on the telephone, sending your provider a message by [facility] may be a faster and more efficient way to get a response.  Please allow 48 business hours for a response.  Please remember that this is for non-urgent requests.  _______________________________________________________  Due to recent changes in healthcare laws, you may see the results of your imaging and laboratory studies on MyChart before your provider has had a chance to review them.  We understand that in some cases there may be results that are confusing or concerning to you. Not all laboratory results come back in the same time frame and the provider may be waiting for multiple results in order to interpret others.  Please give [place] 48 hours in order for your provider to thoroughly review all the results before contacting the office for clarification of your results.    Decrease Protonix to 20 mg once daily.   It has been recommended to you by your physician that you have a(n) EGD completed. Per your request, we did not schedule the procedure(s) today. Please contact our office at [phone] when you are  ready to schedule. You will be scheduled for a pre-visit and procedure at that time.  Thank you for entrusting me with your care and for choosing [facility], Dr. Ileene Patrick

## 2022-06-03 NOTE — Progress Notes (Signed)
HPI :  62 year old female with history of GERD, diverticulosis, gallstones, here to reestablish for care regarding reflux.  I have not seen her since October 2018.  Recall she has been on Protonix 40 mg daily for some time.  She has been on this for many years.  She will take this daily, occasionally has some breakthrough symptoms perhaps every few weeks or so that bother her.  This is sporadic and often related to something she is eating.  Denies any dysphagia.  No nausea or vomiting.  Recall she has used tobacco in the past but has quit several years ago.  She thinks she had an EGD perhaps 15 to 20 years ago.  That was done in [place].  She has no history of osteoporosis or chronic kidney disease.  She does use Gaviscon or Rolaids as needed for breakthrough for occasional pyrosis.  In January she had a severe episode that really bothered her.  Sounds like she had significant upper abdominal pain that got worse and she reports was told that her gallbladder was inflamed and she had a cholecystectomy in January.  She states that quickly resolved her abdominal pain and she has been doing much better in that regard since then.  Her last colonoscopy was in 2018 and showed no polyps or precancerous changes.  We recommended repeat exam in 10 years.  No family history of colon cancer or esophagus cancer.   Colonoscopy 10/14/2012 - no polyps, diverticulosis left colon   Colonoscopy 10/28/2016 - - The perianal and digital rectal examinations were normal. - The terminal ileum appeared normal. - Internal hemorrhoids were found during retroflexion. The hemorrhoids were small. - The exam was otherwise without abnormality. No polyps.  Repeat exam in 10 years    Past Medical History:  Diagnosis Date   Anemia    Anxiety    Gallstones    GERD (gastroesophageal reflux disease)    H/O hiatal hernia    Meniere disease      Past Surgical History:  Procedure Laterality Date   BONE EXOSTOSIS EXCISION Left  03/17/2012   Procedure: EXOSTOSIS EXCISION;  Surgeon: Dallas Schimke, DPM;  Location: [facility];  Service: Orthopedics;  Laterality: Left;  Excision of Os-Peroneum Left Foot   CHOLECYSTECTOMY  01/2022   COLONOSCOPY  10/14/2012   Dr. Elder Cyphers   roatator cuff Left 2006,June   [facility]   TONSILLECTOMY  1981,Dec.   [place]   Family History  Problem Relation Age of Onset   Hepatitis Mother    Breast cancer Mother    Heart attack Father    Hypertension Sister    Heart attack Sister    Stroke Sister    Diabetes Sister    Emphysema Sister    Hypertension Sister    GI problems Maternal Grandmother    Heart attack Maternal Grandfather    Cirrhosis Paternal Grandfather    Colon cancer Neg Hx    Rectal cancer Neg Hx    Esophageal cancer Neg Hx    Liver cancer Neg Hx    Social History   Tobacco Use   Smoking status: Former    Packs/day: 0.50    Years: 6.00    Additional pack years: 0.00    Total pack years: 3.00    Types: Cigarettes   Smokeless tobacco: Never  Vaping Use   Vaping Use: Never used  Substance Use Topics   Alcohol use: Yes    Comment: Occasionally   Drug use: No   Current  Outpatient Medications  Medication Sig Dispense Refill   atorvastatin (LIPITOR) 10 MG tablet Take 10 mg by mouth daily.     desvenlafaxine (PRISTIQ) 50 MG 24 hr tablet Take 50 mg by mouth daily.     diclofenac sodium (VOLTAREN) 1 % GEL as needed.     hydrochlorothiazide (HYDRODIURIL) 12.5 MG tablet TK 1 T PO QAM PRN  1   MULTIPLE VITAMIN PO Take 1 tablet by mouth daily.     pantoprazole (PROTONIX) 40 MG tablet Take 40 mg by mouth daily.     traMADol (ULTRAM) 50 MG tablet Take 50 mg by mouth daily.     traZODone (DESYREL) 100 MG tablet Take 100 mg by mouth at bedtime.     No current facility-administered medications for this visit.   Allergies  Allergen Reactions   Codeine Nausea And Vomiting   Tetracyclines & Related Swelling     Review of Systems: All systems reviewed and  negative except where noted in HPI.    Physical Exam: BP 108/68 (BP Location: Left Arm, Patient Position: Sitting, Cuff Size: Normal)   Pulse 72   Ht 5' 3.5" (1.613 m)   Wt 233 lb (105.7 kg)   SpO2 98%   BMI 40.63 kg/m  Constitutional: Pleasant,well-developed, female in no acute distress. HEENT: Normocephalic and atraumatic. Conjunctivae are normal. No scleral icterus. Neck supple.  Cardiovascular: Normal rate, regular rhythm.  Pulmonary/chest: Effort normal and breath sounds normal. No wheezing, rales or rhonchi. Abdominal: Soft, nondistended, nontender. There are no masses palpable. No hepatomegaly. Extremities: no edema Lymphadenopathy: No cervical adenopathy noted. Neurological: Alert and oriented to person place and time. Skin: Skin is warm and dry. No rashes noted. Psychiatric: Normal mood and affect. Behavior is normal.   ASSESSMENT: 62 y.o. female here for assessment of the following  1. Gastroesophageal reflux disease, unspecified whether esophagitis present   2. Long-term current use of proton pump inhibitor therapy   3. Status post cholecystectomy    Longstanding GERD which typically is well-controlled on moderate dose Protonix.  Uses occasional Gaviscon or Rolaids as needed for breakthrough.  We discussed her course to date and if she wanted to consider a screening endoscopy for Barrett's esophagus given her age, duration of symptoms, ethnicity, BMI, history of tobacco use, she meets criteria for screening.  Her last exam was done in her early 63s.  We discussed risks and benefits of endoscopy and anesthesia, she wants to proceed with this.  She will need to coordinate a driver for this and will call us back for scheduling at her convenience.  Otherwise we discussed long-term management of her GERD.  She is on moderate dose PPI and has been so for some time.  I relayed risks of chronic PPI use with her to include increased risk for bone fracture, increased risk for  chronic kidney disease, C. difficile etc.  Long-term want her to use the lowest dose needed to control her symptoms.  I think may be reasonable to try dose reduction to 20 mg once daily for a few weeks and see how she does.  If she is not happy with that regimen and having more breakthrough then she can increase back to 40 mg if needed.  She agrees with this plan.  Otherwise has recovered well from cholecystectomy and clearly this provided benefit for abdominal pain.  She is doing well in this regard.  PLAN: - counseled on long term risks of chronic PPI use - try dose reduction of protonix to  20mg  / day. She will cut pill in half and let us know if she needs a different dose. Long term use lowest dose needed to control symptoms - offered screening EGD for Barrett's screening - she needs to coordinate a driver but wants to call back to schedule  Harlin Rain, MD [facility]

## 2022-11-30 HISTORY — PX: KNEE SURGERY: SHX244

## 2023-03-26 ENCOUNTER — Telehealth: Payer: Self-pay | Admitting: Gastroenterology

## 2023-03-26 NOTE — Telephone Encounter (Signed)
 Follow up appt has been moved to Friday, 04/02/23 at 10:20 am with [place], PA. Patient is aware.

## 2023-03-26 NOTE — Telephone Encounter (Signed)
 Patient called stated she's been having a lot of upper GI issues related to what she thought was [place]. She said she went to see an ENT doctor but they were not able to help her, was recommended to call our office. Now she says she's spitting up blood and would like to get an earlier appt if possible. She also asked if you could leave a detailed message and she will try to return the call during her break at work.

## 2023-04-02 ENCOUNTER — Ambulatory Visit: Admitting: Gastroenterology

## 2023-04-02 ENCOUNTER — Encounter: Payer: Self-pay | Admitting: Gastroenterology

## 2023-04-02 VITALS — BP 122/70 | HR 82 | Ht 63.5 in | Wt 232.2 lb

## 2023-04-02 DIAGNOSIS — Z87891 Personal history of nicotine dependence: Secondary | ICD-10-CM

## 2023-04-02 DIAGNOSIS — J359 Chronic disease of tonsils and adenoids, unspecified: Secondary | ICD-10-CM | POA: Diagnosis not present

## 2023-04-02 DIAGNOSIS — R09A2 Foreign body sensation, throat: Secondary | ICD-10-CM

## 2023-04-02 DIAGNOSIS — R1310 Dysphagia, unspecified: Secondary | ICD-10-CM

## 2023-04-02 DIAGNOSIS — K219 Gastro-esophageal reflux disease without esophagitis: Secondary | ICD-10-CM

## 2023-04-02 NOTE — Progress Notes (Signed)
 Agree with assessment and plan as outlined.

## 2023-04-02 NOTE — Patient Instructions (Addendum)
 You have been scheduled for an endoscopy. Please follow written instructions given to you at your visit today.  If you use inhalers (even only as needed), please bring them with you on the day of your procedure. ___________________________________________________________________________  Due to recent changes in healthcare laws, you may see the results of your imaging and laboratory studies on MyChart before your provider has had a chance to review them.  We understand that in some cases there may be results that are confusing or concerning to you. Not all laboratory results come back in the same time frame and the provider may be waiting for multiple results in order to interpret others.  Please give [place] 48 hours in order for your provider to thoroughly review all the results before contacting the office for clarification of your results.   Thank you for trusting me with your gastrointestinal care!   Boone Master, PA

## 2023-04-02 NOTE — Progress Notes (Signed)
 Chief Complaint: GERD Primary GI MD: Dr. Adela Lank  HPI: 63 year old female history of GERD, diverticulosis, gallstones s/p cholecystectomy, presents for evaluation of GERD  Last seen May 2024 by Dr. Adela Lank.  See his note for details.  At last office visit she was having GERD with occasional breakthrough symptoms.  She was offered EGD for Barrett's screening but appears this was not pursued.  ---------TODAY------------------  Patient presents with persistent throat discomfort and swallowing difficulties.  She has been experiencing persistent throat discomfort and difficulty swallowing, particularly solids, since October. This is described as a sensation of a lump in her throat, which has been intermittent and associated with episodes of acid reflux. The reflux causes acid to rise in her throat, leading to a sensation of strangulation. Despite various treatments, including three different antibiotics and two anti-inflammatories, there has been no relief.  Initially prescribed Nexium was ineffective, so she was switched to famotidine 40 mg, which provided partial relief. She self-medicated by increasing the dose to 60 mg twice daily, significantly improving her symptoms over the past three days. She has not combined Nexium with famotidine but retains the medication for potential future use. Her current medication regimen includes famotidine 60 mg twice daily, which she acknowledges is above the recommended dose.  A biopsy of her lingual tonsil and adjacent areas was performed, revealing benign results. She has experienced blood-tinged phlegm but denies vomiting blood. Her symptoms, including a burning sensation in her throat and sensitivity, have been exacerbated by stress.  She attributes the exacerbation of her symptoms to significant stress due to long-term family caregiving responsibilities and recent bereavements, including the death of her partner's brother.      PREVIOUS GI  WORKUP   Colonoscopy 10/14/2012 - no polyps, diverticulosis left colon   Colonoscopy 10/28/2016 - - The perianal and digital rectal examinations were normal. - The terminal ileum appeared normal. - Internal hemorrhoids were found during retroflexion. The hemorrhoids were small. - The exam was otherwise without abnormality. No polyps.   Repeat exam in 10 years  Past Medical History:  Diagnosis Date   Anemia    Anxiety    Gallstones    GERD (gastroesophageal reflux disease)    H/O hiatal hernia    Meniere disease     Past Surgical History:  Procedure Laterality Date   BONE EXOSTOSIS EXCISION Left 03/17/2012   Procedure: EXOSTOSIS EXCISION;  Surgeon: Dallas Schimke, DPM;  Location: [facility];  Service: Orthopedics;  Laterality: Left;  Excision of Os-Peroneum Left Foot   CHOLECYSTECTOMY  01/2022   COLONOSCOPY  10/14/2012   Dr. Elder Cyphers   KNEE SURGERY Left 11/30/2022   torn meniscus. fractured femur   roatator cuff Left 2006,June   [facility]   TONSILLECTOMY  1981,Dec.   [place]    Current Outpatient Medications  Medication Sig Dispense Refill   atorvastatin (LIPITOR) 10 MG tablet Take 10 mg by mouth daily.     desvenlafaxine (PRISTIQ) 50 MG 24 hr tablet Take 50 mg by mouth daily.     diclofenac sodium (VOLTAREN) 1 % GEL as needed.     famotidine (PEPCID) 40 MG tablet Take 40 mg by mouth 2 (two) times daily.     hydrochlorothiazide (HYDRODIURIL) 12.5 MG tablet TK 1 T PO QAM PRN  1   meclizine (ANTIVERT) 25 MG tablet Take 25 mg by mouth as needed.     MULTIPLE VITAMIN PO Take 1 tablet by mouth daily.     traMADol (ULTRAM) 50 MG tablet Take 50  mg by mouth daily.     traZODone (DESYREL) 100 MG tablet Take 100 mg by mouth at bedtime.     No current facility-administered medications for this visit.    Allergies as of 04/02/2023 - Review Complete 04/02/2023  Allergen Reaction Noted   Codeine Nausea And Vomiting 03/03/2012   Tetracyclines & related Swelling 03/03/2012     Family History  Problem Relation Age of Onset   Hepatitis Mother        granuloma   Breast cancer Mother    Heart attack Father    Hypertension Sister    Heart attack Sister    Stroke Sister    Diabetes Sister    Emphysema Sister    Hypertension Sister    GI problems Maternal Grandmother    Heart attack Maternal Grandfather    Cirrhosis Paternal Grandfather    Colon cancer Neg Hx    Rectal cancer Neg Hx    Esophageal cancer Neg Hx    Liver cancer Neg Hx     Social History   Socioeconomic History   Marital status: Single    Spouse name: Not on file   Number of children: 0   Years of education: Not on file   Highest education level: Not on file  Occupational History   Occupation: [employer]  Tobacco Use   Smoking status: Former    Current packs/day: 0.50    Average packs/day: 0.5 packs/day for 6.0 years (3.0 ttl pk-yrs)    Types: Cigarettes   Smokeless tobacco: Never  Vaping Use   Vaping status: Never Used  Substance and Sexual Activity   Alcohol use: Yes    Comment: Occasionally   Drug use: No   Sexual activity: Not on file  Other Topics Concern   Not on file  Social History Narrative   Not on file   Social Drivers of Health   Financial Resource Strain: Not on file  Food Insecurity: Not on file  Transportation Needs: Not on file  Physical Activity: Not on file  Stress: Not on file  Social Connections: Not on file  Intimate Partner Violence: Not on file    Review of Systems:    Constitutional: No weight loss, fever, chills, weakness or fatigue HEENT: Eyes: No change in vision               Ears, Nose, Throat:  No change in hearing or congestion Skin: No rash or itching Cardiovascular: No chest pain, chest pressure or palpitations   Respiratory: No SOB or cough Gastrointestinal: See HPI and otherwise negative Genitourinary: No dysuria or change in urinary frequency Neurological: No headache, dizziness or syncope Musculoskeletal: No new  muscle or joint pain Hematologic: No bleeding or bruising Psychiatric: No history of depression or anxiety    Physical Exam:  Vital signs: BP 122/70   Pulse 82   Ht 5' 3.5" (1.613 m)   Wt 232 lb 4 oz (105.3 kg)   SpO2 98%   BMI 40.50 kg/m   Constitutional: NAD, Well developed, Well nourished, alert and cooperative Head:  Normocephalic and atraumatic. Eyes:   PEERL, EOMI. No icterus. Conjunctiva pink. Respiratory: Respirations even and unlabored. Lungs clear to auscultation bilaterally.   No wheezes, crackles, or rhonchi.  Cardiovascular:  Regular rate and rhythm. No peripheral edema, cyanosis or pallor.  Gastrointestinal:  Soft, nondistended, nontender. No rebound or guarding. Normal bowel sounds. No appreciable masses or hepatomegaly. Rectal:  Not performed.  Msk:  Symmetrical without gross deformities.  Without edema, no deformity or joint abnormality.  Neurologic:  Alert and  oriented x4;  grossly normal neurologically.  Skin:   Dry and intact without significant lesions or rashes. Psychiatric: Oriented to person, place and time. Demonstrates good judgement and reason without abnormal affect or behaviors.    RELEVANT LABS AND IMAGING: CBC    Component Value Date/Time   HGB 13.5 03/09/2012 1430   HCT 38.5 03/09/2012 1430    CMP     Component Value Date/Time   NA 136 03/09/2012 1430   K 3.8 03/09/2012 1430   CL 99 03/09/2012 1430   CO2 27 03/09/2012 1430   GLUCOSE 122 (H) 03/09/2012 1430   BUN 15 03/09/2012 1430   CREATININE 0.73 03/09/2012 1430   CALCIUM 9.7 03/09/2012 1430   GFRNONAA >90 03/09/2012 1430   GFRAA >90 03/09/2012 1430     Assessment/Plan:   GERD Dysphagia Globus sensation Chronic GERD with persistent symptoms improved with famotidine 60 Mg twice daily. Previous Nexium ineffective. Stress contributes. Current famotidine dose exceeds recommended maximum.  No previous EGD though it was recommended in 2024 but was not pursued.  Previously trialed  Carafate with no relief as well.  Suspect GERD resulting in globus sensation. - Decrease famotidine to 40 mg twice daily. - Add Nexium 40 Mg in the morning. - Schedule endoscopy to evaluate for esophagitis, gastritis, PUD, stricture - I thoroughly discussed the procedure with the patient (at bedside) to include nature of the procedure, alternatives, benefits, and risks (including but not limited to bleeding, infection, perforation, anesthesia/cardiac pulmonary complications).  Patient verbalized understanding and gave verbal consent to proceed with procedure.   Lingual Tonsil Swelling Intermittent swelling and pain likely related to acid reflux. Benign biopsy confirms no malignancy per ENT.     Boone Master, PA-C [facility] 04/02/2023, 10:59 AM  Cc: No ref. provider found

## 2023-04-05 ENCOUNTER — Ambulatory Visit (AMBULATORY_SURGERY_CENTER): Admitting: Gastroenterology

## 2023-04-05 ENCOUNTER — Encounter: Payer: Self-pay | Admitting: Gastroenterology

## 2023-04-05 VITALS — BP 145/85 | HR 94 | Temp 99.1°F | Resp 17 | Ht 63.5 in | Wt 232.0 lb

## 2023-04-05 DIAGNOSIS — R1310 Dysphagia, unspecified: Secondary | ICD-10-CM

## 2023-04-05 DIAGNOSIS — R09A2 Foreign body sensation, throat: Secondary | ICD-10-CM | POA: Diagnosis not present

## 2023-04-05 DIAGNOSIS — J385 Laryngeal spasm: Secondary | ICD-10-CM

## 2023-04-05 DIAGNOSIS — R0902 Hypoxemia: Secondary | ICD-10-CM

## 2023-04-05 DIAGNOSIS — K219 Gastro-esophageal reflux disease without esophagitis: Secondary | ICD-10-CM

## 2023-04-05 MED ORDER — SODIUM CHLORIDE 0.9 % IV SOLN: 500.0000 mL | INTRAVENOUS | Status: DC

## 2023-04-05 NOTE — Progress Notes (Signed)
 Pt arrived to recovery unit with a bloody nose.  Pt tolerating water without difficulty.  Nosebleed stopped by discharge.

## 2023-04-05 NOTE — Progress Notes (Signed)
VS by [facility]

## 2023-04-05 NOTE — Patient Instructions (Signed)
 YOU HAD AN ENDOSCOPIC PROCEDURE TODAY AT THE [facility]:   Refer to the procedure report that was given to you for any specific questions about what was found during the examination.  If the procedure report does not answer your questions, please call your gastroenterologist to clarify.  If you requested that your care partner not be given the details of your procedure findings, then the procedure report has been included in a sealed envelope for you to review at your convenience later.  Increase nexium to twice daily  YOU SHOULD EXPECT: Some feelings of bloating in the abdomen. Passage of more gas than usual.  Walking can help get rid of the air that was put into your GI tract during the procedure and reduce the bloating. If you had a lower endoscopy (such as a colonoscopy or flexible sigmoidoscopy) you may notice spotting of blood in your stool or on the toilet paper. If you underwent a bowel prep for your procedure, you may not have a normal bowel movement for a few days.  Please Note:  You might notice some irritation and congestion in your nose or some drainage.  This is from the oxygen used during your procedure.  There is no need for concern and it should clear up in a day or so.  SYMPTOMS TO REPORT IMMEDIATELY:  Following upper endoscopy (EGD)  Vomiting of blood or coffee ground material  New chest pain or pain under the shoulder blades  Painful or persistently difficult swallowing  New shortness of breath  Fever of 100F or higher  Black, tarry-looking stools  For urgent or emergent issues, a gastroenterologist can be reached at any hour by calling [phone]. Do not use MyChart messaging for urgent concerns.    DIET:  We do recommend a small meal at first, but then you may proceed to your regular diet.  Drink plenty of fluids but you should avoid alcoholic beverages for 24 hours.  ACTIVITY:  You should plan to take it easy for the rest of today and you should NOT  DRIVE or use heavy machinery until tomorrow (because of the sedation medicines used during the test).    FOLLOW UP: Our staff will call the number listed on your records the next business day following your procedure.  We will call around 7:15- 8:00 am to check on you and address any questions or concerns that you may have regarding the information given to you following your procedure. If we do not reach you, we will leave a message.     If any biopsies were taken you will be contacted by phone or by letter within the next 1-3 weeks.  Please call us at [phone] if you have not heard about the biopsies in 3 weeks.    SIGNATURES/CONFIDENTIALITY: You and/or your care partner have signed paperwork which will be entered into your electronic medical record.  These signatures attest to the fact that that the information above on your After Visit Summary has been reviewed and is understood.  Full responsibility of the confidentiality of this discharge information lies with you and/or your care-partner.

## 2023-04-05 NOTE — Op Note (Signed)
 [facility] Patient Name: Yesenia Cox Procedure Date: 04/05/2023 3:40 PM MRN: [id number] Endoscopist: Viviann Spare P. Adela Lank , MD, [id number] Age: 63 Referring MD:  Date of Birth: [birth year] Gender: Female Account #: [id number] Procedure:                Upper GI endoscopy Indications:              Dysphagia, history of gastro-esophageal reflux                            disease, Globus sensation - on longstanding PPI,                            symptoms persist despite nexium / pepcid (recently                            combined), failed nexium monotherapy Medicines:                Monitored Anesthesia Care Procedure:                Pre-Anesthesia Assessment:                           - Prior to the procedure, a History and Physical                            was performed, and patient medications and                            allergies were reviewed. The patient's tolerance of                            previous anesthesia was also reviewed. The risks                            and benefits of the procedure and the sedation                            options and risks were discussed with the patient.                            All questions were answered, and informed consent                            was obtained. Prior Anticoagulants: The patient has                            taken no anticoagulant or antiplatelet agents. ASA                            Grade Assessment: II - A patient with mild systemic                            disease. After reviewing the risks and benefits,  the patient was deemed in satisfactory condition to                            undergo the procedure.                           After obtaining informed consent, the endoscope was                            passed under direct vision. Throughout the                            procedure, the patient's blood pressure, pulse, and                            oxygen  saturations were monitored continuously. The                            GIF [id number] #[id number] was introduced through the                            mouth, and advanced to the second part of duodenum.                            The upper GI endoscopy was accomplished without                            difficulty. The patient tolerated the procedure                            poorly due to development of laryngospasm as                            outlined below. Scope In: Scope Out: Findings:                 The Z-line was regular. No Barrett's. No erosive                            esophagitis.                           The exam of the esophagus was otherwise normal.                           Limited exam of the the gastric body and gastric                            antrum were normal. Proximal stomach not well seen                            when the endoscope was removed due to hypoxia.                           The examined duodenum was normal. Upon  evaluating                            the second portion of the duodenum the patient had                            a quick drop in her oxygen saturation. Based on                            evaluation we suspected she developed laryngospasm.                            Endoscope immediately was withdrawn, airway managed                            per anesthesia. Ventilated with ambu bag which                            broke laryngospasm, oral airway and subsequent                            nasal trumpet placed per anesthesia. Oxygen levels                            quickly improved, but then dropped again and ambu                            bag placed again which broke laryngospasm. We                            elected to not replace the endoscope and ended the                            procedure at that point. Retroflexed views of the                            cardia not obtained. I had planned to perform                             empiric dilation due to her symptoms, which was                            also not performed. Complications:            hypoxia secondary to laryngospasm - managed per                            anesthesia with good response. Estimated Blood Loss:     Estimated blood loss was minimal. Impression:               - Z-line regular.                           - Normal esophagus - no erosive changes or  Barrett's. Due to hypoxia as outlined - did not                            perform empiric dilation                           - Normal gastric body and antrum - this was mostly                            seen prior to hypoxia, retroflexed views had not                            been done however.                           - Normal examined duodenum.                           Case ended prematurely prior to clearing proximal                            stomach and performing empiric dilation, but no                            obvious pathology noted to cause symptoms.                           I think reasonable to do barium swallow to get a                            sense of motility, with consideration for 24 hour                            pH + impedance study pending her course (assess for                            burden of nonacid reflux) Recommendation:           - Patient has a contact number available for                            emergencies. The signs and symptoms of potential                            delayed complications were discussed with the                            patient. Return to normal activities tomorrow.                            Written discharge instructions were provided to the                            patient.                           -  Resume previous diet.                           - Continue present medications.                           - Increase nexium to twice daily                           - Order barium swallow                            - Consideration for 24 hour pH impedance study                            pending her course                           - If repeat EGD with empiric dilation is                            considerated, would perform that exam at the                            hospital for anesthesia support given this                            occurance. Viviann Spare P. Jaevin Medearis, MD 04/05/2023 4:19:13 PM This report has been signed electronically.

## 2023-04-05 NOTE — Progress Notes (Signed)
 History and Physical Interval Note: Seen in the office 04/02/23 - here for EGD to evaluate GERD, globus sensation, dysphagia. On nexium 40mg  + pepcid 40mg . Has helped ? But not resolved symptoms. NO prior endoscopy. EGD to further evaluate. Discussed risks / benefits, she understands, wishes to proceed.     04/05/2023 3:41 PM  Yesenia Cox  has presented today for endoscopic procedure(s), with the diagnosis of  Encounter Diagnoses  Name Primary?   Gastroesophageal reflux disease without esophagitis Yes   Dysphagia, unspecified type    Globus sensation   .  The various methods of evaluation and treatment have been discussed with the patient and/or family. After consideration of risks, benefits and other options for treatment, the patient has consented to  the endoscopic procedure(s).   The patient's history has been reviewed, patient examined, no change in status, stable for surgery.  I have reviewed the patient's chart and labs.  Questions were answered to the patient's satisfaction.    Harlin Rain, MD [facility]

## 2023-04-05 NOTE — Progress Notes (Signed)
 Pt with larygngospasmshortly after entry of the EGD scope.  Pt pressure manual bagging with ambu bag  placement of #10 oral airway and #7 nasal airway.  Patients SAT's dipped to 55 briefly then came back to normal levels in the 90's.  Patient woke up and transferrred to PACU. VSS NAD transferred to recovery.Yesenia Cox

## 2023-04-06 ENCOUNTER — Telehealth: Payer: Self-pay

## 2023-04-06 DIAGNOSIS — R1310 Dysphagia, unspecified: Secondary | ICD-10-CM

## 2023-04-06 DIAGNOSIS — R09A2 Foreign body sensation, throat: Secondary | ICD-10-CM

## 2023-04-06 NOTE — Telephone Encounter (Signed)
 Order placed for barium swallow w/ tablet at [facility].  Scheduled for March 24th, Monday at 9:00am to arrive 8:45 NPO 6am.  Called and Left detailed message for patient with appointment info and mailed her a letter. Asked her to call back or call [facility] at [phone] if she would like to reschedule.

## 2023-04-06 NOTE — Telephone Encounter (Signed)
  Follow up Call-     04/05/2023    2:54 PM  Call back number  Post procedure Call Back phone  # [phone]  Permission to leave phone message Yes     Patient questions:  Do you have a fever, pain , or abdominal swelling? No. Pain Score  0 *  Have you tolerated food without any problems? Yes.    Have you been able to return to your normal activities? Yes.    Do you have any questions about your discharge instructions: Diet   No. Medications  No. Follow up visit  No.  Do you have questions or concerns about your Care? No.  Actions: * If pain score is 4 or above: No action needed, pain <4.

## 2023-04-06 NOTE — Telephone Encounter (Signed)
-----   Message from Benancio Deeds sent at 04/05/2023  4:49 PM EDT ----- Regarding: barium swallow Jan can you help order this patient barium swallow with tablet to assess dysphagia, rule out dysmotility. FYI she lives in [place] and may want to have it done there locally.  Thanks

## 2023-04-12 ENCOUNTER — Ambulatory Visit (HOSPITAL_COMMUNITY)
Admission: RE | Admit: 2023-04-12 | Discharge: 2023-04-12 | Disposition: A | Discharge status: Home / Self Care | Source: Ambulatory Visit | Attending: Gastroenterology | Admitting: Gastroenterology

## 2023-04-12 DIAGNOSIS — R09A2 Foreign body sensation, throat: Secondary | ICD-10-CM | POA: Diagnosis present

## 2023-04-12 DIAGNOSIS — R1310 Dysphagia, unspecified: Secondary | ICD-10-CM | POA: Insufficient documentation

## 2023-04-13 ENCOUNTER — Telehealth: Payer: Self-pay

## 2023-04-13 ENCOUNTER — Other Ambulatory Visit (HOSPITAL_COMMUNITY): Payer: Self-pay

## 2023-04-13 ENCOUNTER — Other Ambulatory Visit: Payer: Self-pay

## 2023-04-13 MED ORDER — VOQUEZNA 10 MG PO TABS: 10.0000 mg | ORAL_TABLET | Freq: Every day | ORAL | 3 refills | Status: DC

## 2023-04-13 NOTE — Telephone Encounter (Signed)
 Pharmacy Patient Advocate Encounter   Received notification from Pt Calls Messages that prior authorization for Voquezna 10MG  tablets is required/requested.   Insurance verification completed.   The patient is insured through [facility] .   Per test claim: PA required; PA submitted to above mentioned insurance via CoverMyMeds Key/confirmation #/EOC [id number] Status is pending

## 2023-04-13 NOTE — Telephone Encounter (Signed)
 Pharmacy Patient Advocate Encounter  Received notification from [facility] that Prior Authorization for Voquezna 10MG  tablets has been APPROVED from 04-13-2023 to 10-14-2023   PA #/Case ID/Reference #: [id number]

## 2023-04-13 NOTE — Telephone Encounter (Signed)
 PA request has been Submitted. New Encounter has been or will be created for follow up. For additional info see Pharmacy Prior Auth telephone encounter from 04-13-2023.

## 2023-04-13 NOTE — Telephone Encounter (Signed)
 Prescription has been sent in for voquezna. Will need PA.

## 2023-04-14 NOTE — Telephone Encounter (Signed)
 Spoke with pt and she is aware of results and recommendations per dr. Adela Lank.

## 2023-04-14 NOTE — Telephone Encounter (Signed)
 Inbound call about pathology results. PT wants someone explain all this, not just updated in mychart. Please advise.

## 2023-04-20 NOTE — Telephone Encounter (Signed)
 Inbound call fro patient in regards to Voquzna medication. Requesting to speak with a nurse. Please advise.

## 2023-04-20 NOTE — Telephone Encounter (Signed)
 Called [facility]. They reran the claim.  It is approved but the patient's copay is over $660 for a 30 day supply. This makes them eligible for specialty $50 co pay pricing which does not apply to their deductible. The $660 would apply to their deductible. They will call the patient to discuss which way she wants to go and will ship the medication to the patient

## 2023-04-21 ENCOUNTER — Ambulatory Visit: Payer: BC Managed Care – PPO | Admitting: Gastroenterology

## 2023-04-22 ENCOUNTER — Other Ambulatory Visit: Payer: Self-pay

## 2023-04-22 MED ORDER — VOQUEZNA 10 MG PO TABS: 10.0000 mg | ORAL_TABLET | Freq: Every day | ORAL | 5 refills | Status: DC

## 2023-04-23 NOTE — Telephone Encounter (Signed)
 Inbound call from patient stating insurance will not medication. Requesting a call to discuss how she should proceed. Please advise, thank you.

## 2023-04-26 NOTE — Telephone Encounter (Signed)
 Called patient. She has not heard from [facility]. [facility] told her that the medication was approved and that [facility] may be able to give her specialty pricing.  Provided patient with Number to call [facility]:  [phone] .  She indicated she will call them and let us know if she has any other issues

## 2023-05-28 ENCOUNTER — Ambulatory Visit: Payer: BC Managed Care – PPO | Admitting: Gastroenterology

## 2023-06-23 ENCOUNTER — Ambulatory Visit: Admitting: Gastroenterology

## 2023-08-04 ENCOUNTER — Other Ambulatory Visit: Payer: Self-pay | Admitting: Gastroenterology

## 2023-10-01 ENCOUNTER — Other Ambulatory Visit (HOSPITAL_COMMUNITY): Payer: Self-pay

## 2023-10-01 ENCOUNTER — Telehealth: Payer: Self-pay

## 2023-10-01 NOTE — Telephone Encounter (Signed)
 Pharmacy Patient Advocate Encounter   Received notification from CoverMyMeds that prior authorization for Voquezna  10MG  tablets is required/requested.   Insurance verification completed.   The patient is insured through [facility] .   Per test claim: PA required; PA submitted to above mentioned insurance via Latent Key/confirmation #/EOC [id number] Status is pending

## 2023-10-05 NOTE — Telephone Encounter (Signed)
 Called and spoke to patient.  She indicates she has tried pantoprazole (Protonix) 40 mg once daily, Nexium 40 mg Once a day and Omeprazole (unsure of dose). Can we resubmit PA request please? Thank you

## 2023-10-05 NOTE — Telephone Encounter (Signed)
 Jan can you let the patient know that Voquezna  is being denied until she fails another PPI. She has failed nexium twice daily. Can you ask her if she has been on any other PPIS in the past year? If not, recommend protonix 40mg  BID - take 30-60 minutes prior to a meal. If she does this for 8 weeks and no benefit, then we can reapply for Voquezna  if that is her preference / works better for her. Thanks

## 2023-10-05 NOTE — Telephone Encounter (Signed)
 Pharmacy Patient Advocate Encounter  Information has been sent to clinical pharmacist for appeals review. It may take 5-7 days to prepare the necessary documentation to request the appeal from the insurance.

## 2023-10-05 NOTE — Telephone Encounter (Signed)
 Pharmacy Patient Advocate Encounter  Received notification from [employer] that Prior Authorization for Voquezna  10MG  tablets has been DENIED.  Full denial letter will be uploaded to the media tab. See denial reason below.  Per your health plan's criteria, this drug is covered if you meet the following:  (1) Your doctor provides medical records (for example: chart notes) showing one of the following:  (A) You have tried (a minimum 8-week supply) and did not respond well to (within the last 365 days) to two of the following generic proton pump inhibitors: Omeprazole, esomeprazole, pantoprazole, lansoprazole, rabeprazole, dexlansoprazole (B) You cannot use all of the following generic proton pump inhibitors: Omeprazole, esomeprazole, pantoprazole, lansoprazole, rabeprazole, dexlansoprazole   PA #/Case ID/Reference #: [id number]

## 2023-10-06 ENCOUNTER — Telehealth: Payer: Self-pay | Admitting: Pharmacist

## 2023-10-06 NOTE — Telephone Encounter (Signed)
 Appeal has been submitted for Voquenza. Will advise when response is received, please be advised that most companies may take 30 days to make a decision. Appeal letter and supporting documentation have been faxed to [phone] on 10/06/2023 @3 :27 pm.  Thank you, Devere Pandy, PharmD Clinical Pharmacist  [employer]  Direct Dial: [phone]

## 2023-10-11 NOTE — Telephone Encounter (Signed)
 The appeal for Voquenza has been approved by the insurance for one month.  The full letter can be found under the media tab.

## 2023-11-14 ENCOUNTER — Other Ambulatory Visit: Payer: Self-pay | Admitting: Gastroenterology
